# Patient Record
Sex: Male | Born: 1940
Health system: Southern US, Community
[De-identification: ages and names within clinical notes are randomized; demographics above are authoritative.]

## PROBLEM LIST (undated history)

## (undated) DIAGNOSIS — I519 Heart disease, unspecified: Secondary | ICD-10-CM

## (undated) DIAGNOSIS — G4730 Sleep apnea, unspecified: Secondary | ICD-10-CM

## (undated) DIAGNOSIS — N189 Chronic kidney disease, unspecified: Secondary | ICD-10-CM

## (undated) DIAGNOSIS — I2510 Atherosclerotic heart disease of native coronary artery without angina pectoris: Secondary | ICD-10-CM

## (undated) DIAGNOSIS — I82402 Acute embolism and thrombosis of unspecified deep veins of left lower extremity: Secondary | ICD-10-CM

## (undated) DIAGNOSIS — E785 Hyperlipidemia, unspecified: Secondary | ICD-10-CM

## (undated) DIAGNOSIS — I2699 Other pulmonary embolism without acute cor pulmonale: Secondary | ICD-10-CM

## (undated) HISTORY — DX: Heart disease, unspecified: I51.9

## (undated) HISTORY — DX: Chronic kidney disease, unspecified: N18.9

## (undated) HISTORY — DX: Hyperlipidemia, unspecified: E78.5

---
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
## 2015-09-02 DIAGNOSIS — Z951 Presence of aortocoronary bypass graft: Secondary | ICD-10-CM | POA: Insufficient documentation

## 2015-12-06 DIAGNOSIS — I82402 Acute embolism and thrombosis of unspecified deep veins of left lower extremity: Secondary | ICD-10-CM

## 2015-12-06 DIAGNOSIS — I2699 Other pulmonary embolism without acute cor pulmonale: Secondary | ICD-10-CM

## 2015-12-06 HISTORY — DX: Other pulmonary embolism without acute cor pulmonale: I26.99

## 2015-12-06 HISTORY — PX: PROSTATE SURGERY: SHX751

## 2015-12-06 HISTORY — DX: Acute embolism and thrombosis of unspecified deep veins of left lower extremity: I82.402

## 2015-12-06 HISTORY — PX: CORONARY ARTERY BYPASS GRAFT: SHX141

## 2016-12-05 HISTORY — PX: TOTAL SHOULDER REPLACEMENT: SUR1217

## 2019-01-10 ENCOUNTER — Ambulatory Visit: Payer: 59 | Admitting: Internal Medicine

## 2019-01-10 ENCOUNTER — Encounter: Payer: Self-pay | Admitting: Internal Medicine

## 2019-01-10 VITALS — BP 106/70 | HR 61 | Ht 68.0 in | Wt 172.4 lb

## 2019-01-10 DIAGNOSIS — E782 Mixed hyperlipidemia: Secondary | ICD-10-CM

## 2019-01-10 DIAGNOSIS — Z951 Presence of aortocoronary bypass graft: Secondary | ICD-10-CM | POA: Diagnosis not present

## 2019-01-10 DIAGNOSIS — I77810 Thoracic aortic ectasia: Secondary | ICD-10-CM | POA: Diagnosis not present

## 2019-01-10 NOTE — Progress Notes (Signed)
OFFICE NOTE  Chief Complaint:  Establish cardiologist  Primary Care Physician: Cole Prince, MD  HPI:  Cole Walker is a 78 y.o. male with a past medial history significant for coronary artery disease with onset of shortness of breath in 2016.  Cardiac catheterization showed a 99% proximal LAD lesion and ultimately he underwent off pump CABG with LIMA to LAD by Dr. Merleen Walker. Through a left thoracotomy approach.  It is also mentioned that he has a patent ductus arteriosus -it is not clear to me that this was ligated at the time of surgery but may not have been due to the decreased exposure.  He has had some shortness of breath subsequent to this.  Apparently his presenting symptoms was shortness of breath, but he claims he never had chest pain.  More recently he underwent repeat stress testing in June 2019 which was negative for ischemia.  He also has a history of PE/DVT which occurred apparently after shoulder surgery.  He was anticoagulated on warfarin for 6 months and then discontinue that.  He recently established with Dr. Evlyn Walker.  Labs in January 2020 indicated total cholesterol 145, HDL 50, LDL 63 and triglycerides 162.  PMHx:  Past Medical History:  Diagnosis Date  . Chronic kidney disease   . Heart disease   . Hyperlipidemia     Past Surgical History:  Procedure Laterality Date  . CORONARY ARTERY BYPASS GRAFT  2017  . PROSTATE SURGERY  2017  . TOTAL SHOULDER REPLACEMENT  2018    FAMHx:  Family History  Problem Relation Age of Onset  . Heart attack Mother   . Hypertension Mother   . Diabetes Mother   . Stroke Sister   . Cancer Brother     SOCHx:   reports that he has never smoked. His smokeless tobacco use includes chew. No history on file for alcohol and drug.  ALLERGIES:  Allergies  Allergen Reactions  . Sulfa Antibiotics Other (See Comments)    Other reaction(s): GI Upset (intolerance) Other reaction(s): GI Upset (intolerance) Unsure. he was told years  ago.     ROS: Pertinent items noted in HPI and remainder of comprehensive ROS otherwise negative.  HOME MEDS: Current Outpatient Medications on File Prior to Visit  Medication Sig Dispense Refill  . aspirin 81 MG chewable tablet Chew 81 mg by mouth daily.    . Biotin 5000 MCG CAPS Take by mouth.    Marland Kitchen CALCIUM CARBONATE ANTACID PO Take by mouth.    Marland Kitchen Cod Liver Oil CAPS Take by mouth.    . cyanocobalamin 1000 MCG tablet Take 1,000 mcg by mouth daily.    . cycloSPORINE (RESTASIS) 0.05 % ophthalmic emulsion 1 drop 2 (two) times daily.    . finasteride (PROSCAR) 5 MG tablet Take 5 mg by mouth daily.    . folic acid (FOLVITE) 400 MCG tablet Take 400 mcg by mouth daily.    . polyethylene glycol (MIRALAX) packet Take 17 g by mouth daily.    . pravastatin (PRAVACHOL) 40 MG tablet Take 40 mg by mouth daily.    . traMADol (ULTRAM) 50 MG tablet Take by mouth every 6 (six) hours as needed.    . Cholecalciferol (VITAMIN D) 50 MCG (2000 UT) tablet Take by mouth.     No current facility-administered medications on file prior to visit.     LABS/IMAGING: No results found for this or any previous visit (from the past 48 hour(s)). No results found.  LIPID PANEL: No results  found for: CHOL, TRIG, HDL, CHOLHDL, VLDL, LDLCALC, LDLDIRECT   WEIGHTS: Wt Readings from Last 3 Encounters:  01/10/19 172 lb 6.4 oz (78.2 kg)    VITALS: BP 106/70 (BP Location: Left Arm)   Pulse 61   Ht 5\' 8"  (1.727 m)   Wt 172 lb 6.4 oz (78.2 kg)   BMI 26.21 kg/m   EXAM: General appearance: alert and no distress Neck: no carotid bruit, no JVD and thyroid not enlarged, symmetric, no tenderness/mass/nodules Lungs: clear to auscultation bilaterally Heart: systolic murmur: early systolic 2/6, buzzing at 2nd right intercostal space Abdomen: soft, non-tender; bowel sounds normal; no masses,  no organomegaly Extremities: extremities normal, atraumatic, no cyanosis or edema Pulses: 2+ and symmetric Skin: Skin color,  texture, turgor normal. No rashes or lesions Neurologic: Grossly normal Psych: Pleasant  EKG: Sinus rhythm with first-degree AV block at 61 nonspecific ST changes- personally reviewed  ASSESSMENT: 1. CAD with 99% LAD stenosis-status post LIMA to LAD (OPCAB)-2016, [place], [place] Cole Denmark, MD) 2. ? PDA 3. OSA on CPAP 4. Hyperlipidemia 5. CKD 6. History of dilated aortic root  PLAN: 1.   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  There is a question of PDA although the murmur is not entirely consistent with that.  He has a history of OSA on CPAP and dyslipidemia goal LDL.  He had a negative stress test in June last year.  He is on appropriate medications.  No changes were made today.  We will plan a repeat echo in 1 year to reassess his dilated aortic root.  Plan follow-up with me annually or sooner as necessary.  Cole Nose, MD, [facility], FACP  [place]  [facility]  Medical Director of the Advanced Lipid Disorders &  Cardiovascular Risk Reduction Clinic Diplomate of the American Board of Clinical Lipidology Attending Cardiologist  Direct Dial: [phone]  Fax: [phone]  Website:  [email or web address] 01/10/2019, 5:37 PM

## 2019-01-10 NOTE — Patient Instructions (Signed)
Medication Instructions:  Continue current medications  If you need a refill on your cardiac medications before your next appointment, please call your pharmacy.  Labwork: None Ordered   Take the provided lab slips with you to the lab for your blood draw.   When you have your labs (blood work) drawn today and your tests are completely normal, you will receive your results only by MyChart Message (if you have MyChart) -OR-  A paper copy in the mail.  If you have any lab test that is abnormal or we need to change your treatment, we will call you to review these results.  Testing/Procedures: Your physician has requested that you have an echocardiogram in 1 Year. Echocardiography is a painless test that uses sound waves to create images of your heart. It provides your doctor with information about the size and shape of your heart and how well your heart's chambers and valves are working. This procedure takes approximately one hour. There are no restrictions for this procedure.   Follow-Up: You will need a follow up appointment in 1 Year.  Please call our office 2 months in advance to schedule this appointment.  You may see Dr Rennis Golden or one of the following Advanced Practice Providers on your designated Care Team: Azalee Course, [place] . Micah Flesher, PA-C    At [facility], you and your health needs are our priority.  As part of our continuing mission to provide you with exceptional heart care, we have created designated Provider Care Teams.  These Care Teams include your primary Cardiologist (physician) and Advanced Practice Providers (APPs -  Physician Assistants and Nurse Practitioners) who all work together to provide you with the care you need, when you need it.  Thank you for choosing [facility] at [facility]!!

## 2019-02-06 ENCOUNTER — Other Ambulatory Visit (HOSPITAL_COMMUNITY): Payer: Self-pay | Admitting: Orthopedic Surgery

## 2019-02-06 ENCOUNTER — Other Ambulatory Visit: Payer: Self-pay | Admitting: Orthopedic Surgery

## 2019-02-06 DIAGNOSIS — Z96612 Presence of left artificial shoulder joint: Secondary | ICD-10-CM

## 2019-02-12 ENCOUNTER — Other Ambulatory Visit (HOSPITAL_COMMUNITY): Payer: Medicare Other

## 2019-02-15 ENCOUNTER — Encounter (HOSPITAL_COMMUNITY)
Admission: RE | Admit: 2019-02-15 | Discharge: 2019-02-15 | Disposition: A | Discharge status: Home / Self Care | Payer: Medicare Other | Source: Ambulatory Visit | Attending: Orthopedic Surgery | Admitting: Orthopedic Surgery

## 2019-02-15 ENCOUNTER — Other Ambulatory Visit: Payer: Self-pay

## 2019-02-15 DIAGNOSIS — Z96612 Presence of left artificial shoulder joint: Secondary | ICD-10-CM | POA: Insufficient documentation

## 2019-02-15 MED ORDER — TECHNETIUM TC 99M MEDRONATE IV KIT
20.0000 | PACK | Freq: Once | INTRAVENOUS | Status: AC | PRN
  Administered 2019-02-15: 20 via INTRAVENOUS

## 2019-05-30 IMAGING — NM NUCLEAR MEDICINE THREE PHASE BONE SCAN
10 series · 20 of 20 positions shown · non-contrast
Comparison: None.

CLINICAL DATA: History of right total shoulder arthroplasty.
Evaluate for arthroplasty device loosening or infection.

EXAM:
NUCLEAR MEDICINE 3-PHASE BONE SCAN
TECHNIQUE: Radionuclide angiographic images, immediate static blood pool
images, and 3-hour delayed static images were obtained of the upper
chest after intravenous injection of radiopharmaceutical.
RADIOPHARMACEUTICALS:  21.5 mCi 5c-55m MDP IV

[Series 1: flow · 2.07mm/px · 6 of 48 frames shown (1 of 2)]
[frame 5/48]
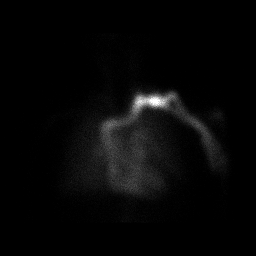
[frame 13/48]
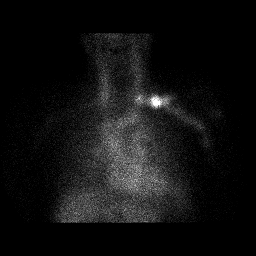
[frame 21/48]
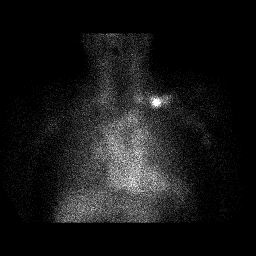
[frame 29/48]
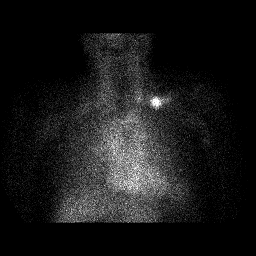
[frame 37/48]
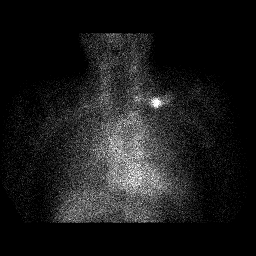
[frame 45/48]
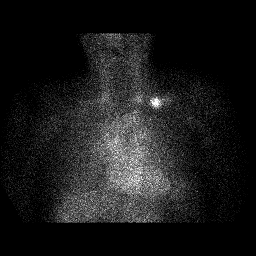

[Series 1: flow · 2.07mm/px · 6 of 48 frames shown (2 of 2)]
[frame 5/48]
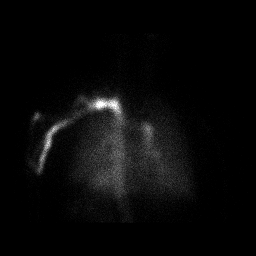
[frame 13/48]
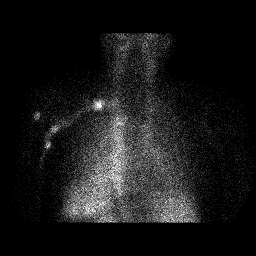
[frame 21/48]
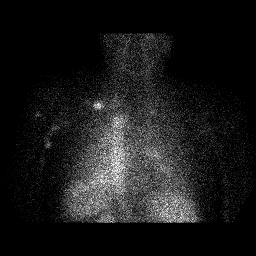
[frame 29/48]
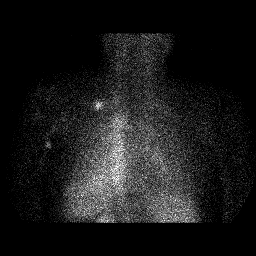
[frame 37/48]
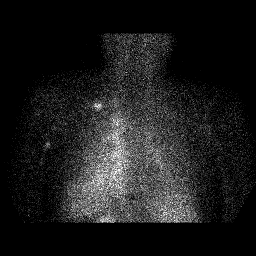
[frame 45/48]
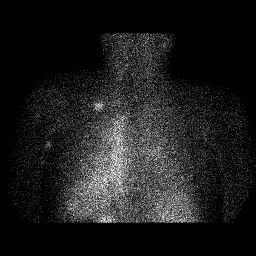

[Series 2: blood pool · 2.07mm/px · 1 of 1 slices shown (1 of 2)]
[im 1/1]
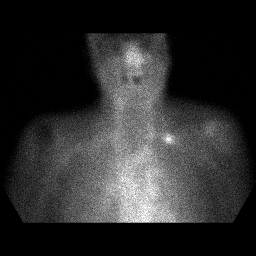

[Series 2: blood pool · 2.07mm/px · 1 of 1 slices shown (2 of 2)]
[im 1/1]
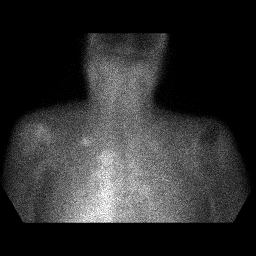

[Series 3: lat bp · 2.07mm/px · 1 of 1 slices shown (1 of 2)]
[im 1/1]
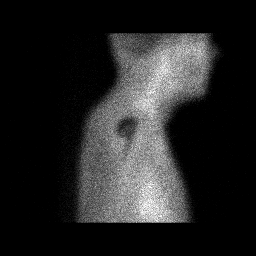

[Series 3: lat bp · 2.07mm/px · 1 of 1 slices shown (2 of 2)]
[im 1/1]
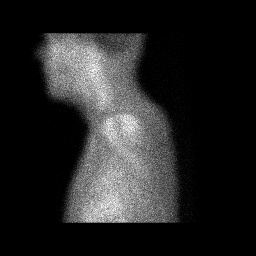

[Series 4: delay · delayed · 2.07mm/px · 1 of 1 slices shown (1 of 4)]
[im 1/1]
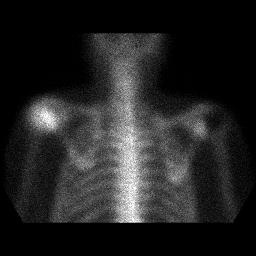

[Series 4: delay · delayed · 2.07mm/px · 1 of 1 slices shown (2 of 4)]
[im 1/1]
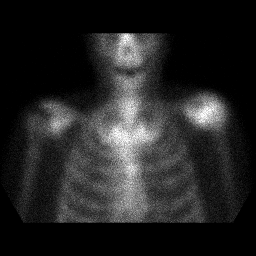

[Series 5: delay · delayed · 2.07mm/px · 1 of 1 slices shown (3 of 4)]
[im 1/1]
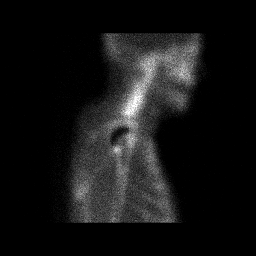

[Series 5: delay · delayed · 2.07mm/px · 1 of 1 slices shown (4 of 4)]
[im 1/1]
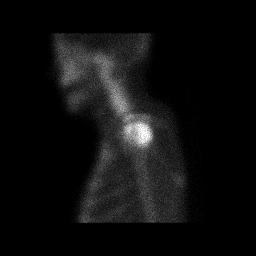

[20 of 20 positions shown; findings below may reference images not displayed]

FINDINGS: Vascular phase: On the flow phase portion of the examination there
is no abnormal increased radiotracer activity localizing to the
right shoulder region.

Blood pool phase: On the blood pool phase portion of the exam there
is no significant asymmetric increased uptake localizing to the
right shoulder. Mild increased blood pool activity is identified in
the left shoulder region.

Delayed phase: Increased uptake localizing to the glenoid region of
the right shoulder, nonspecific. Increased radiotracer activity is
noted within the glenohumeral joint of the left shoulder.
IMPRESSION: 1. No specific findings identified to suggest right shoulder
arthroplasty device loosening or infection.
2. Nonspecific increased uptake localizes to the glenoid region of
the right shoulder
3. Left shoulder degenerative changes.

## 2019-06-05 NOTE — Progress Notes (Signed)
01-10-19 (Epic) EKG and LOV w/ Dr. Debara Pickett Cardiologist

## 2019-06-05 NOTE — Patient Instructions (Addendum)
YOU NEED TO HAVE A COVID 19 TEST ON 06-10-19 @ 3:05 PM, THIS TEST MUST BE DONE BEFORE SURGERY, [facility]. ONCE YOUR COVID TEST IS COMPLETED, PLEASE BEGIN THE QUARANTINE INSTRUCTIONS AS OUTLINED IN YOUR HANDOUT.                Cole Walker    Your procedure is scheduled on: 06-13-2019   [facility]    Report to admitting at 5:30  AM    Call this number if you have problems the morning of surgery [phone]    Remember:  NO SOLID FOOD AFTER MIDNIGHT THE NIGHT PRIOR TO SURGERY. NOTHING BY MOUTH EXCEPT CLEAR LIQUIDS UNTIL 430 AM. PLEASE FINISH ENSURE DRINK PER SURGEON ORDER 3 HOURS PRIOR TO SCHEDULED SURGERY TIME WHICH NEEDS TO BE COMPLETED AT 430 AM.   CLEAR LIQUID DIET   Foods Allowed                                                                     Foods Excluded  Coffee and tea, regular and decaf                             liquids that you cannot  Plain Jell-O in any flavor                                             see through such as: Fruit ices (not with fruit pulp)                                     milk, soups, orange juice  Iced Popsicles                                    All solid food Carbonated beverages, regular and diet                                    Cranberry, grape and apple juices Sports drinks like Gatorade Lightly seasoned clear broth or consume(fat free) Sugar, honey syrup  Sample Menu Breakfast                                Lunch                                     Supper Cranberry juice                    Beef broth                            Chicken broth Jell-O  Grape juice                           Apple juice Coffee or tea                        Jell-O                                      Popsicle                                                Coffee or tea                        Coffee or  tea  _____________________________________________________________________     Take these medicines the morning of surgery with A SIP OF WATER: None      BRUSH YOUR TEETH MORNING OF SURGERY AND RINSE YOUR MOUTH OUT, NO CHEWING GUM CANDY OR MINTS.                           You may not have any metal on your body including hair pins and              piercings     Do not wear jewelry, cologne, lotions, powders or deodorant                         Men may shave face and neck.   Do not bring valuables to the hospital. [facility] IS NOT             RESPONSIBLE   FOR VALUABLES.  Contacts, dentures or bridgework may not be worn into surgery.       _____________________________________________________________________             [facility] Pre[facility] Before surgery, you can play an important role.  Because skin is not sterile, your skin needs to be as free of germs as possible.  You can reduce the number of germs on your skin by washing with CHG (chlorahexidine gluconate) soap before surgery.  CHG is an antiseptic cleaner which kills germs and bonds with the skin to continue killing germs even after washing. Please DO NOT use if you have an allergy to CHG or antibacterial soaps.  If your skin becomes reddened/irritated stop using the CHG and inform your nurse when you arrive at Short Stay. Do not shave (including legs and underarms) for at least 48 hours prior to the first CHG shower.  You may shave your face/neck. Please follow these instructions carefully:  1.  Shower with CHG Soap the night before surgery and the  morning of Surgery.  2.  If you choose to wash your hair, wash your hair first as usual with your  normal  shampoo.  3.  After you shampoo, rinse your hair and body thoroughly to remove the  shampoo.                           4.  Use CHG as you would any other liquid soap.  You can  apply chg directly  to the skin and wash                       Gently with a  scrungie or clean washcloth.  5.  Apply the CHG Soap to your body ONLY FROM THE NECK DOWN.   Do not use on face/ open                           Wound or open sores. Avoid contact with eyes, ears mouth and genitals (private parts).                       Wash face,  Genitals (private parts) with your normal soap.             6.  Wash thoroughly, paying special attention to the area where your surgery  will be performed.  7.  Thoroughly rinse your body with warm water from the neck down.  8.  DO NOT shower/wash with your normal soap after using and rinsing off  the CHG Soap.                9.  Pat yourself dry with a clean towel.            10.  Wear clean pajamas.            11.  Place clean sheets on your bed the night of your first shower and do not  sleep with pets. Day of Surgery : Do not apply any lotions/deodorants the morning of surgery.  Please wear clean clothes to the hospital/surgery center.  FAILURE TO FOLLOW THESE INSTRUCTIONS MAY RESULT IN THE CANCELLATION OF YOUR SURGERY PATIENT SIGNATURE_________________________________  NURSE SIGNATURE__________________________________  ________________________________________________________________________   Cole Walker  An incentive spirometer is a tool that can help keep your lungs clear and active. This tool measures how well you are filling your lungs with each breath. Taking long deep breaths may help reverse or decrease the chance of developing breathing (pulmonary) problems (especially infection) following:  A long period of time when you are unable to move or be active. BEFORE THE PROCEDURE   If the spirometer includes an indicator to show your best effort, your nurse or respiratory therapist will set it to a desired goal.  If possible, sit up straight or lean slightly forward. Try not to slouch.  Hold the incentive spirometer in an upright position. INSTRUCTIONS FOR USE  1. Sit on the edge of your bed if possible,  or sit up as far as you can in bed or on a chair. 2. Hold the incentive spirometer in an upright position. 3. Breathe out normally. 4. Place the mouthpiece in your mouth and seal your lips tightly around it. 5. Breathe in slowly and as deeply as possible, raising the piston or the ball toward the top of the column. 6. Hold your breath for 3-5 seconds or for as long as possible. Allow the piston or ball to fall to the bottom of the column. 7. Remove the mouthpiece from your mouth and breathe out normally. 8. Rest for a few seconds and repeat Steps 1 through 7 at least 10 times every 1-2 hours when you are awake. Take your time and take a few normal breaths between deep breaths. 9. The spirometer may include an indicator to show your best effort. Use the indicator as a goal to work toward during  each repetition. 10. After each set of 10 deep breaths, practice coughing to be sure your lungs are clear. If you have an incision (the cut made at the time of surgery), support your incision when coughing by placing a pillow or rolled up towels firmly against it. Once you are able to get out of bed, walk around indoors and cough well. You may stop using the incentive spirometer when instructed by your caregiver.  RISKS AND COMPLICATIONS  Take your time so you do not get dizzy or light-headed.  If you are in pain, you may need to take or ask for pain medication before doing incentive spirometry. It is harder to take a deep breath if you are having pain. AFTER USE  Rest and breathe slowly and easily.  It can be helpful to keep track of a log of your progress. Your caregiver can provide you with a simple table to help with this. If you are using the spirometer at home, follow these instructions: SEEK MEDICAL CARE IF:   You are having difficultly using the spirometer.  You have trouble using the spirometer as often as instructed.  Your pain medication is not giving enough relief while using the  spirometer.  You develop fever of 100.5 F (38.1 C) or higher. SEEK IMMEDIATE MEDICAL CARE IF:   You cough up bloody sputum that had not been present before.  You develop fever of 102 F (38.9 C) or greater.  You develop worsening pain at or near the incision site. MAKE SURE YOU:   Understand these instructions.  Will watch your condition.  Will get help right away if you are not doing well or get worse. Document Released: 04/03/2007 Document Revised: 02/13/2012 Document Reviewed: 06/04/2007 [facility] Patient Information 2014 [place].   ________________________________________________________________________

## 2019-06-06 ENCOUNTER — Encounter (HOSPITAL_COMMUNITY): Payer: Self-pay

## 2019-06-06 ENCOUNTER — Other Ambulatory Visit: Payer: Self-pay

## 2019-06-06 ENCOUNTER — Encounter (HOSPITAL_COMMUNITY)
Admission: RE | Admit: 2019-06-06 | Discharge: 2019-06-06 | Disposition: A | Discharge status: Home / Self Care | Payer: Medicare Other | Source: Ambulatory Visit | Attending: Orthopedic Surgery | Admitting: Orthopedic Surgery

## 2019-06-06 DIAGNOSIS — M75102 Unspecified rotator cuff tear or rupture of left shoulder, not specified as traumatic: Secondary | ICD-10-CM | POA: Insufficient documentation

## 2019-06-06 DIAGNOSIS — Z01812 Encounter for preprocedural laboratory examination: Secondary | ICD-10-CM | POA: Diagnosis present

## 2019-06-06 HISTORY — DX: Sleep apnea, unspecified: G47.30

## 2019-06-06 HISTORY — DX: Atherosclerotic heart disease of native coronary artery without angina pectoris: I25.10

## 2019-06-06 LAB — BASIC METABOLIC PANEL
Anion gap: 8 (ref 5–15)
BUN: 24 mg/dL — ABNORMAL HIGH (ref 8–23)
CO2: 29 mmol/L (ref 22–32)
Calcium: 9.4 mg/dL (ref 8.9–10.3)
Chloride: 106 mmol/L (ref 98–111)
Creatinine, Ser: 1.49 mg/dL — ABNORMAL HIGH (ref 0.61–1.24)
GFR calc Af Amer: 51 mL/min — ABNORMAL LOW (ref >60)
GFR calc non Af Amer: 44 mL/min — ABNORMAL LOW (ref >60)
Glucose, Bld: 86 mg/dL (ref 70–99)
Potassium: 4.2 mmol/L (ref 3.5–5.1)
Sodium: 143 mmol/L (ref 135–145)

## 2019-06-06 LAB — CBC
HCT: 43.2 % (ref 39.0–52.0)
Hemoglobin: 14 g/dL (ref 13.0–17.0)
MCH: 30.2 pg (ref 26.0–34.0)
MCHC: 32.4 g/dL (ref 30.0–36.0)
MCV: 93.3 fL (ref 80.0–100.0)
Platelets: 224 10*3/uL (ref 150–400)
RBC: 4.63 MIL/uL (ref 4.22–5.81)
RDW: 12.3 % (ref 11.5–15.5)
WBC: 6.4 10*3/uL (ref 4.0–10.5)
nRBC: 0 % (ref 0.0–0.2)

## 2019-06-06 LAB — SURGICAL PCR SCREEN
MRSA, PCR: NEGATIVE
Staphylococcus aureus: NEGATIVE

## 2019-06-10 ENCOUNTER — Other Ambulatory Visit (HOSPITAL_COMMUNITY)
Admission: RE | Admit: 2019-06-10 | Discharge: 2019-06-10 | Disposition: A | Discharge status: Home / Self Care | Payer: Medicare Other | Source: Ambulatory Visit | Attending: Orthopedic Surgery | Admitting: Orthopedic Surgery

## 2019-06-10 DIAGNOSIS — Z01812 Encounter for preprocedural laboratory examination: Secondary | ICD-10-CM | POA: Insufficient documentation

## 2019-06-10 DIAGNOSIS — Z1159 Encounter for screening for other viral diseases: Secondary | ICD-10-CM | POA: Insufficient documentation

## 2019-06-10 NOTE — Progress Notes (Signed)
Anesthesia Chart Review   Case: [id number] Date/Time: 06/13/19 0715   Procedure: REVERSE SHOULDER ARTHROPLASTY (Left ) - 144min   Anesthesia type: General   Pre-op diagnosis: left shoulder osteoarthritis   Location: [facility] ROOM 06 / [facility]   Surgeon: Justice Britain, MD      DISCUSSION: 78 y.o. never smoker with h/o HLD, CAD (CABG with LIMA to LAD 2016), DVT/PE 2017 after shoulder surgery, CKD, sleep apnea on CPAP, left shoulder OA scheduled for above procedure 06/13/2019 with Dr. Justice Britain.   Stress test 05/2018 negative for ischemia.    Pt last seen by cardiologist, Dr. Lyman Bishop, 01/10/2019.  Stable at this visit with no medication changes.  Recommended repeat echo in 1 year with 1 year follow up with Dr. Debara Pickett.   Anticipate pt can proceed with planned procedure barring acute status change.   VS: BP 124/70   Pulse 63   Temp 37.2 C (Oral)   Resp 18   Ht 5\' 8"  (1.727 m)   Wt 79.6 kg   SpO2 100%   BMI 26.67 kg/m   PROVIDERS: Reynold Bowen, MD is PCP   Lyman Bishop, MD is Cardiologist  LABS: Labs reviewed: Acceptable for surgery. (all labs ordered are listed, but only abnormal results are displayed)  Labs Reviewed  BASIC METABOLIC PANEL - Abnormal; Notable for the following components:      Result Value   BUN 24 (*)    Creatinine, Ser 1.49 (*)    GFR calc non Af Amer 44 (*)    GFR calc Af Amer 51 (*)    All other components within normal limits  SURGICAL PCR SCREEN  CBC     IMAGES:   EKG: 01/10/2019 Rate 61 bpm Sinus rhythm with 1st degree AV block  Nonspecific T wave abnormality   CV:  Past Medical History:  Diagnosis Date  . Chronic kidney disease   . Coronary artery disease   . Deep vein blood clot of left lower extremity ([place]) 2017  . Heart disease   . Hyperlipidemia   . Pulmonary emboli ([place]) 2017  . Sleep apnea     Past Surgical History:  Procedure Laterality Date  . CORONARY ARTERY BYPASS GRAFT  2017  . PROSTATE SURGERY  2017  . TOTAL  SHOULDER REPLACEMENT  2018    MEDICATIONS: . aspirin 81 MG chewable tablet  . Biotin 5000 MCG CAPS  . calcium carbonate (TUMS - DOSED IN MG ELEMENTAL CALCIUM) 500 MG chewable tablet  . Cholecalciferol (VITAMIN D) 50 MCG (2000 UT) tablet  . Cod Liver Oil 1000 MG CAPS  . cycloSPORINE (RESTASIS) 0.05 % ophthalmic emulsion  . finasteride (PROSCAR) 5 MG tablet  . folic acid (FOLVITE) 403 MCG tablet  . Multiple Vitamin (MULTIVITAMIN WITH MINERALS) TABS tablet  . polyethylene glycol (MIRALAX) packet  . pravastatin (PRAVACHOL) 40 MG tablet  . vitamin B-12 (CYANOCOBALAMIN) 250 MCG tablet   No current facility-administered medications for this encounter.     Maia Plan [facility] Pre-Surgical Testing [phone] 06/10/19  11:58 AM

## 2019-06-10 NOTE — Anesthesia Preprocedure Evaluation (Addendum)
Anesthesia Evaluation  Patient identified by MRN, date of birth, ID band Patient awake    Reviewed: Allergy & Precautions, NPO status , Patient's Chart, lab work & pertinent test results  Airway Mallampati: II  TM Distance: >3 FB Neck ROM: Full    Dental no notable dental hx.    Pulmonary sleep apnea and Continuous Positive Airway Pressure Ventilation , PE   Pulmonary exam normal breath sounds clear to auscultation       Cardiovascular Exercise Tolerance: Good + CAD, + CABG and + DVT  Normal cardiovascular exam Rhythm:Regular Rate:Normal  ECG: SR, 1st degree AV block  Pt last seen by cardiologist, Dr. Lyman Bishop, 01/10/2019.  Stable at this visit with no medication changes.  Recommended repeat echo in 1 year with 1 year follow up with Dr. Debara Pickett.    Neuro/Psych negative neurological ROS  negative psych ROS   GI/Hepatic negative GI ROS, Neg liver ROS,   Endo/Other  negative endocrine ROS  Renal/GU Renal disease     Musculoskeletal negative musculoskeletal ROS (+)   Abdominal   Peds  Hematology HLD   Anesthesia Other Findings left shoulder osteoarthritis  Reproductive/Obstetrics                            Anesthesia Physical Anesthesia Plan  ASA: III  Anesthesia Plan: General and Regional   Post-op Pain Management: GA combined w/ Regional for post-op pain   Induction: Intravenous  PONV Risk Score and Plan: 2 and Ondansetron, Dexamethasone and Treatment may vary due to age or medical condition  Airway Management Planned: Oral ETT  Additional Equipment:   Intra-op Plan:   Post-operative Plan: Extubation in OR  Informed Consent: I have reviewed the patients History and Physical, chart, labs and discussed the procedure including the risks, benefits and alternatives for the proposed anesthesia with the patient or authorized representative who has indicated his/her understanding and  acceptance.     Dental advisory given  Plan Discussed with: CRNA  Anesthesia Plan Comments: (Reviewed PAT note 06/06/2019, Konrad Felix, PA-C)      Anesthesia Quick Evaluation

## 2019-06-11 LAB — SARS CORONAVIRUS 2 (TAT 6-24 HRS): SARS Coronavirus 2: NEGATIVE

## 2019-06-13 ENCOUNTER — Inpatient Hospital Stay (HOSPITAL_COMMUNITY): Payer: Medicare Other | Admitting: Certified Registered Nurse Anesthetist

## 2019-06-13 ENCOUNTER — Encounter (HOSPITAL_COMMUNITY): Payer: Self-pay | Admitting: *Deleted

## 2019-06-13 ENCOUNTER — Encounter (HOSPITAL_COMMUNITY)
Admission: RE | Disposition: A | Discharge status: Home / Self Care | Payer: Self-pay | Source: Ambulatory Visit | Attending: Orthopedic Surgery

## 2019-06-13 ENCOUNTER — Other Ambulatory Visit: Payer: Self-pay

## 2019-06-13 ENCOUNTER — Inpatient Hospital Stay (HOSPITAL_COMMUNITY)
Admission: RE | Admit: 2019-06-13 | Discharge: 2019-06-14 | DRG: 483 | Disposition: A | Discharge status: Home / Self Care | Payer: Medicare Other | Source: Ambulatory Visit | Attending: Orthopedic Surgery | Admitting: Orthopedic Surgery

## 2019-06-13 ENCOUNTER — Inpatient Hospital Stay (HOSPITAL_COMMUNITY): Payer: Medicare Other | Admitting: Physician Assistant

## 2019-06-13 DIAGNOSIS — Z951 Presence of aortocoronary bypass graft: Secondary | ICD-10-CM | POA: Diagnosis not present

## 2019-06-13 DIAGNOSIS — Z96619 Presence of unspecified artificial shoulder joint: Secondary | ICD-10-CM | POA: Diagnosis present

## 2019-06-13 DIAGNOSIS — M19012 Primary osteoarthritis, left shoulder: Secondary | ICD-10-CM | POA: Diagnosis present

## 2019-06-13 DIAGNOSIS — E785 Hyperlipidemia, unspecified: Secondary | ICD-10-CM | POA: Diagnosis present

## 2019-06-13 DIAGNOSIS — M75102 Unspecified rotator cuff tear or rupture of left shoulder, not specified as traumatic: Secondary | ICD-10-CM | POA: Diagnosis present

## 2019-06-13 DIAGNOSIS — G4730 Sleep apnea, unspecified: Secondary | ICD-10-CM | POA: Diagnosis present

## 2019-06-13 DIAGNOSIS — Z79899 Other long term (current) drug therapy: Secondary | ICD-10-CM

## 2019-06-13 DIAGNOSIS — I2510 Atherosclerotic heart disease of native coronary artery without angina pectoris: Secondary | ICD-10-CM | POA: Diagnosis present

## 2019-06-13 DIAGNOSIS — N189 Chronic kidney disease, unspecified: Secondary | ICD-10-CM | POA: Diagnosis present

## 2019-06-13 DIAGNOSIS — Z96612 Presence of left artificial shoulder joint: Secondary | ICD-10-CM

## 2019-06-13 DIAGNOSIS — Z86711 Personal history of pulmonary embolism: Secondary | ICD-10-CM

## 2019-06-13 DIAGNOSIS — Z7982 Long term (current) use of aspirin: Secondary | ICD-10-CM | POA: Diagnosis not present

## 2019-06-13 DIAGNOSIS — Z86718 Personal history of other venous thrombosis and embolism: Secondary | ICD-10-CM

## 2019-06-13 HISTORY — PX: REVERSE SHOULDER ARTHROPLASTY: SHX5054

## 2019-06-13 SURGERY — ARTHROPLASTY, SHOULDER, TOTAL, REVERSE
Anesthesia: Regional | Laterality: Left

## 2019-06-13 MED ORDER — DEXAMETHASONE SODIUM PHOSPHATE 10 MG/ML IJ SOLN
INTRAMUSCULAR | Status: AC
  Filled 2019-06-13: qty 1

## 2019-06-13 MED ORDER — FENTANYL CITRATE (PF) 100 MCG/2ML IJ SOLN
INTRAMUSCULAR | Status: AC
  Filled 2019-06-13: qty 2

## 2019-06-13 MED ORDER — CHLORHEXIDINE GLUCONATE 4 % EX LIQD: 60.0000 mL | Freq: Once | CUTANEOUS | Status: DC

## 2019-06-13 MED ORDER — ACETAMINOPHEN 500 MG PO TABS
1000.0000 mg | ORAL_TABLET | Freq: Once | ORAL | Status: AC
  Administered 2019-06-13: 1000 mg via ORAL

## 2019-06-13 MED ORDER — ONDANSETRON HCL 4 MG/2ML IJ SOLN: 4.0000 mg | Freq: Once | INTRAMUSCULAR | Status: DC | PRN

## 2019-06-13 MED ORDER — ROCURONIUM BROMIDE 10 MG/ML (PF) SYRINGE
PREFILLED_SYRINGE | INTRAVENOUS | Status: AC
  Filled 2019-06-13: qty 10

## 2019-06-13 MED ORDER — HYDROMORPHONE HCL 1 MG/ML IJ SOLN: 0.5000 mg | INTRAMUSCULAR | Status: DC | PRN

## 2019-06-13 MED ORDER — FENTANYL CITRATE (PF) 100 MCG/2ML IJ SOLN
INTRAMUSCULAR | Status: DC | PRN
  Administered 2019-06-13 (×2): 50 ug via INTRAVENOUS

## 2019-06-13 MED ORDER — GLYCOPYRROLATE PF 0.2 MG/ML IJ SOSY
PREFILLED_SYRINGE | INTRAMUSCULAR | Status: DC | PRN
  Administered 2019-06-13: 0.4 mg via INTRAVENOUS

## 2019-06-13 MED ORDER — DIPHENHYDRAMINE HCL 12.5 MG/5ML PO ELIX: 12.5000 mg | ORAL_SOLUTION | ORAL | Status: DC | PRN

## 2019-06-13 MED ORDER — CEFAZOLIN SODIUM-DEXTROSE 2-4 GM/100ML-% IV SOLN
INTRAVENOUS | Status: AC
  Filled 2019-06-13: qty 100

## 2019-06-13 MED ORDER — LIDOCAINE 2% (20 MG/ML) 5 ML SYRINGE
INTRAMUSCULAR | Status: AC
  Filled 2019-06-13: qty 5

## 2019-06-13 MED ORDER — ACETAMINOPHEN 325 MG PO TABS
325.0000 mg | ORAL_TABLET | Freq: Four times a day (QID) | ORAL | Status: DC | PRN
  Administered 2019-06-14: 650 mg via ORAL
  Filled 2019-06-13: qty 2

## 2019-06-13 MED ORDER — TRANEXAMIC ACID-NACL 1000-0.7 MG/100ML-% IV SOLN
1000.0000 mg | INTRAVENOUS | Status: AC
  Administered 2019-06-13: 1000 mg via INTRAVENOUS

## 2019-06-13 MED ORDER — METHOCARBAMOL 500 MG PO TABS
500.0000 mg | ORAL_TABLET | Freq: Four times a day (QID) | ORAL | Status: DC | PRN
  Administered 2019-06-14: 500 mg via ORAL
  Filled 2019-06-13: qty 1

## 2019-06-13 MED ORDER — OXYCODONE HCL 5 MG PO TABS
5.0000 mg | ORAL_TABLET | ORAL | Status: DC | PRN
  Filled 2019-06-13: qty 1

## 2019-06-13 MED ORDER — METHOCARBAMOL 500 MG IVPB - SIMPLE MED
500.0000 mg | Freq: Four times a day (QID) | INTRAVENOUS | Status: DC | PRN
  Filled 2019-06-13: qty 50

## 2019-06-13 MED ORDER — LACTATED RINGERS IV SOLN
INTRAVENOUS | Status: DC
  Administered 2019-06-13: 13:00:00 via INTRAVENOUS

## 2019-06-13 MED ORDER — ASPIRIN 81 MG PO CHEW
81.0000 mg | CHEWABLE_TABLET | Freq: Every day | ORAL | Status: DC
  Administered 2019-06-14: 81 mg via ORAL
  Filled 2019-06-13: qty 1

## 2019-06-13 MED ORDER — MAGNESIUM CITRATE PO SOLN: 1.0000 | Freq: Once | ORAL | Status: DC | PRN

## 2019-06-13 MED ORDER — ONDANSETRON HCL 4 MG PO TABS: 4.0000 mg | ORAL_TABLET | Freq: Four times a day (QID) | ORAL | Status: DC | PRN

## 2019-06-13 MED ORDER — POLYETHYLENE GLYCOL 3350 17 G PO PACK: 17.0000 g | PACK | Freq: Every day | ORAL | Status: DC | PRN

## 2019-06-13 MED ORDER — SUGAMMADEX SODIUM 200 MG/2ML IV SOLN
INTRAVENOUS | Status: DC | PRN
  Administered 2019-06-13: 160 mg via INTRAVENOUS

## 2019-06-13 MED ORDER — ONDANSETRON HCL 4 MG/2ML IJ SOLN
INTRAMUSCULAR | Status: DC | PRN
  Administered 2019-06-13: 4 mg via INTRAVENOUS

## 2019-06-13 MED ORDER — ONDANSETRON HCL 4 MG/2ML IJ SOLN: 4.0000 mg | Freq: Four times a day (QID) | INTRAMUSCULAR | Status: DC | PRN

## 2019-06-13 MED ORDER — TRANEXAMIC ACID-NACL 1000-0.7 MG/100ML-% IV SOLN
INTRAVENOUS | Status: AC
  Filled 2019-06-13: qty 100

## 2019-06-13 MED ORDER — SODIUM CHLORIDE 0.9 % IR SOLN
Status: DC | PRN
  Administered 2019-06-13: 1000 mL

## 2019-06-13 MED ORDER — ONDANSETRON HCL 4 MG/2ML IJ SOLN
INTRAMUSCULAR | Status: AC
  Filled 2019-06-13: qty 2

## 2019-06-13 MED ORDER — MENTHOL 3 MG MT LOZG: 1.0000 | LOZENGE | OROMUCOSAL | Status: DC | PRN

## 2019-06-13 MED ORDER — PHENOL 1.4 % MT LIQD: 1.0000 | OROMUCOSAL | Status: DC | PRN

## 2019-06-13 MED ORDER — OXYCODONE HCL 5 MG PO TABS: 10.0000 mg | ORAL_TABLET | ORAL | Status: DC | PRN

## 2019-06-13 MED ORDER — METOCLOPRAMIDE HCL 5 MG/ML IJ SOLN: 5.0000 mg | Freq: Three times a day (TID) | INTRAMUSCULAR | Status: DC | PRN

## 2019-06-13 MED ORDER — BISACODYL 5 MG PO TBEC: 5.0000 mg | DELAYED_RELEASE_TABLET | Freq: Every day | ORAL | Status: DC | PRN

## 2019-06-13 MED ORDER — MIDAZOLAM HCL 2 MG/2ML IJ SOLN
INTRAMUSCULAR | Status: AC
  Filled 2019-06-13: qty 2

## 2019-06-13 MED ORDER — BUPIVACAINE HCL (PF) 0.5 % IJ SOLN
INTRAMUSCULAR | Status: DC | PRN
  Administered 2019-06-13: 15 mL

## 2019-06-13 MED ORDER — SUCCINYLCHOLINE CHLORIDE 200 MG/10ML IV SOSY
PREFILLED_SYRINGE | INTRAVENOUS | Status: AC
  Filled 2019-06-13: qty 10

## 2019-06-13 MED ORDER — ROCURONIUM BROMIDE 50 MG/5ML IV SOSY
PREFILLED_SYRINGE | INTRAVENOUS | Status: DC | PRN
  Administered 2019-06-13: 50 mg via INTRAVENOUS

## 2019-06-13 MED ORDER — BUPIVACAINE LIPOSOME 1.3 % IJ SUSP
INTRAMUSCULAR | Status: DC | PRN
  Administered 2019-06-13: 10 mL via PERINEURAL

## 2019-06-13 MED ORDER — METOCLOPRAMIDE HCL 5 MG PO TABS: 5.0000 mg | ORAL_TABLET | Freq: Three times a day (TID) | ORAL | Status: DC | PRN

## 2019-06-13 MED ORDER — LIDOCAINE 2% (20 MG/ML) 5 ML SYRINGE
INTRAMUSCULAR | Status: DC | PRN
  Administered 2019-06-13: 40 mg via INTRAVENOUS

## 2019-06-13 MED ORDER — SODIUM CHLORIDE 0.9 % IV SOLN
INTRAVENOUS | Status: DC | PRN
  Administered 2019-06-13: 40 ug/min via INTRAVENOUS

## 2019-06-13 MED ORDER — EPHEDRINE SULFATE-NACL 50-0.9 MG/10ML-% IV SOSY
PREFILLED_SYRINGE | INTRAVENOUS | Status: DC | PRN
  Administered 2019-06-13: 10 mg via INTRAVENOUS

## 2019-06-13 MED ORDER — FINASTERIDE 5 MG PO TABS
5.0000 mg | ORAL_TABLET | Freq: Every day | ORAL | Status: DC
  Administered 2019-06-14: 08:00:00 5 mg via ORAL
  Filled 2019-06-13: qty 1

## 2019-06-13 MED ORDER — DOCUSATE SODIUM 100 MG PO CAPS
100.0000 mg | ORAL_CAPSULE | Freq: Two times a day (BID) | ORAL | Status: DC
  Administered 2019-06-13 – 2019-06-14 (×2): 100 mg via ORAL
  Filled 2019-06-13 (×2): qty 1

## 2019-06-13 MED ORDER — ALUM & MAG HYDROXIDE-SIMETH 200-200-20 MG/5ML PO SUSP: 30.0000 mL | ORAL | Status: DC | PRN

## 2019-06-13 MED ORDER — KETOROLAC TROMETHAMINE 15 MG/ML IJ SOLN
7.5000 mg | Freq: Four times a day (QID) | INTRAMUSCULAR | Status: DC
  Administered 2019-06-14 (×2): 7.5 mg via INTRAVENOUS
  Filled 2019-06-13 (×2): qty 1

## 2019-06-13 MED ORDER — PHENYLEPHRINE HCL (PRESSORS) 10 MG/ML IV SOLN
INTRAVENOUS | Status: AC
  Filled 2019-06-13: qty 1

## 2019-06-13 MED ORDER — CEFAZOLIN SODIUM-DEXTROSE 2-4 GM/100ML-% IV SOLN
2.0000 g | INTRAVENOUS | Status: AC
  Administered 2019-06-13: 2 g via INTRAVENOUS

## 2019-06-13 MED ORDER — FENTANYL CITRATE (PF) 100 MCG/2ML IJ SOLN: 25.0000 ug | INTRAMUSCULAR | Status: DC | PRN

## 2019-06-13 MED ORDER — LACTATED RINGERS IV SOLN
INTRAVENOUS | Status: DC
  Administered 2019-06-13: 06:00:00 via INTRAVENOUS

## 2019-06-13 MED ORDER — ACETAMINOPHEN 500 MG PO TABS
ORAL_TABLET | ORAL | Status: AC
  Administered 2019-06-13: 06:00:00 1000 mg via ORAL
  Filled 2019-06-13: qty 2

## 2019-06-13 MED ORDER — GLYCOPYRROLATE PF 0.2 MG/ML IJ SOSY
PREFILLED_SYRINGE | INTRAMUSCULAR | Status: AC
  Filled 2019-06-13: qty 1

## 2019-06-13 MED ORDER — PROPOFOL 10 MG/ML IV BOLUS
INTRAVENOUS | Status: DC | PRN
  Administered 2019-06-13: 150 mg via INTRAVENOUS

## 2019-06-13 MED ORDER — DEXAMETHASONE SODIUM PHOSPHATE 10 MG/ML IJ SOLN
INTRAMUSCULAR | Status: DC | PRN
  Administered 2019-06-13: 10 mg via INTRAVENOUS

## 2019-06-13 MED ORDER — TRAMADOL HCL 50 MG PO TABS: 50.0000 mg | ORAL_TABLET | Freq: Four times a day (QID) | ORAL | Status: DC | PRN

## 2019-06-13 MED ORDER — PROPOFOL 10 MG/ML IV BOLUS
INTRAVENOUS | Status: AC
  Filled 2019-06-13: qty 20

## 2019-06-13 SURGICAL SUPPLY — 68 items
BAG ZIPLOCK 12X15 (MISCELLANEOUS) ×3 IMPLANT
BLADE SAW SGTL 83.5X18.5 (BLADE) ×3 IMPLANT
COOLER ICEMAN CLASSIC (MISCELLANEOUS) IMPLANT
COVER SURGICAL LIGHT HANDLE (MISCELLANEOUS) ×3 IMPLANT
COVER WAND RF STERILE (DRAPES) IMPLANT
CUP SUT UNIV REVERS 39 NEU (Shoulder) ×2 IMPLANT
DERMABOND ADVANCED (GAUZE/BANDAGES/DRESSINGS) ×2
DERMABOND ADVANCED .7 DNX12 (GAUZE/BANDAGES/DRESSINGS) ×1 IMPLANT
DRAPE INCISE IOBAN 66X45 STRL (DRAPES) IMPLANT
DRAPE ORTHO SPLIT 77X108 STRL (DRAPES) ×4
DRAPE SURG 17X11 SM STRL (DRAPES) ×3 IMPLANT
DRAPE SURG ORHT 6 SPLT 77X108 (DRAPES) ×2 IMPLANT
DRAPE U-SHAPE 47X51 STRL (DRAPES) ×3 IMPLANT
DRESSING AQUACEL AG SP 3.5X10 (GAUZE/BANDAGES/DRESSINGS) IMPLANT
DRSG AQUACEL AG ADV 3.5X10 (GAUZE/BANDAGES/DRESSINGS) ×3 IMPLANT
DRSG AQUACEL AG SP 3.5X10 (GAUZE/BANDAGES/DRESSINGS) ×3
DURAPREP 26ML APPLICATOR (WOUND CARE) ×3 IMPLANT
ELECT BLADE TIP CTD 4 INCH (ELECTRODE) ×3 IMPLANT
ELECT REM PT RETURN 15FT ADLT (MISCELLANEOUS) ×3 IMPLANT
FACESHIELD WRAPAROUND (MASK) ×12 IMPLANT
FACESHIELD WRAPAROUND OR TEAM (MASK) ×4 IMPLANT
GLENOID UNI REV MOD 24 +2 LAT (Joint) ×2 IMPLANT
GLENOSPHERE 39+4 LAT/24 UNI RV (Joint) ×2 IMPLANT
GLOVE BIO SURGEON STRL SZ7.5 (GLOVE) ×3 IMPLANT
GLOVE BIO SURGEON STRL SZ8 (GLOVE) ×3 IMPLANT
GLOVE SS BIOGEL STRL SZ 7 (GLOVE) ×1 IMPLANT
GLOVE SS BIOGEL STRL SZ 7.5 (GLOVE) ×1 IMPLANT
GLOVE SUPERSENSE BIOGEL SZ 7 (GLOVE) ×2
GLOVE SUPERSENSE BIOGEL SZ 7.5 (GLOVE) ×2
GOWN STRL REUS W/TWL LRG LVL3 (GOWN DISPOSABLE) ×6 IMPLANT
INSERT HUMERAL MED 39/ +3 (Shoulder) IMPLANT
INSERT MEDIUM HUMERAL 39/ +3 (Shoulder) ×2 IMPLANT
KIT BASIN OR (CUSTOM PROCEDURE TRAY) ×3 IMPLANT
KIT TURNOVER KIT A (KITS) IMPLANT
MANIFOLD NEPTUNE II (INSTRUMENTS) ×3 IMPLANT
NDL TAPERED W/ NITINOL LOOP (MISCELLANEOUS) ×1 IMPLANT
NEEDLE TAPERED W/ NITINOL LOOP (MISCELLANEOUS) ×3 IMPLANT
NS IRRIG 1000ML POUR BTL (IV SOLUTION) ×3 IMPLANT
PACK SHOULDER (CUSTOM PROCEDURE TRAY) ×3 IMPLANT
PAD ARMBOARD 7.5X6 YLW CONV (MISCELLANEOUS) ×3 IMPLANT
PAD COLD SHLDR WRAP-ON (PAD) ×2 IMPLANT
PIN SET MODULAR GLENOID SYSTEM (PIN) ×2 IMPLANT
PROTECTOR NERVE ULNAR (MISCELLANEOUS) ×3 IMPLANT
RESTRAINT HEAD UNIVERSAL NS (MISCELLANEOUS) ×3 IMPLANT
SCREW CENTRAL MOD 30MM (Screw) ×2 IMPLANT
SCREW PERI LOCK 5.5X16 (Screw) ×2 IMPLANT
SCREW PERI LOCK 5.5X36 (Screw) ×2 IMPLANT
SCREW PERIPHERAL 5.5X20 LOCK (Screw) ×2 IMPLANT
SCREW PERIPHERAL 5.5X28 LOCK (Screw) ×2 IMPLANT
SLING ARM FOAM STRAP LRG (SOFTGOODS) ×2 IMPLANT
SLING ARM FOAM STRAP MED (SOFTGOODS) IMPLANT
SPACER REVERSE UNI 39/ +6MM (Shoulder) ×1 IMPLANT
SPACER SHLD UNI REV 39 +6 (Shoulder) ×1 IMPLANT
SPONGE LAP 18X18 RF (DISPOSABLE) IMPLANT
STEM HUMERAL UNIVER REV SIZE 7 (Stem) ×2 IMPLANT
SUCTION FRAZIER HANDLE 10FR (MISCELLANEOUS) ×4
SUCTION FRAZIER HANDLE 12FR (TUBING) ×2
SUCTION TUBE FRAZIER 10FR DISP (MISCELLANEOUS) IMPLANT
SUCTION TUBE FRAZIER 12FR DISP (TUBING) ×1 IMPLANT
SUT MNCRL AB 3-0 PS2 18 (SUTURE) ×3 IMPLANT
SUT MON AB 2-0 CT1 36 (SUTURE) ×3 IMPLANT
SUT VIC AB 1 CT1 36 (SUTURE) ×3 IMPLANT
SUTURE TAPE 1.3 40 TPR END (SUTURE) ×2 IMPLANT
SUTURETAPE 1.3 40 TPR END (SUTURE) ×6
TOWEL OR 17X26 10 PK STRL BLUE (TOWEL DISPOSABLE) ×3 IMPLANT
TOWEL OR NON WOVEN STRL DISP B (DISPOSABLE) ×3 IMPLANT
UNIVERS REVERS HUMERAL STEM (Orthopedic Implant) ×2 IMPLANT
WATER STERILE IRR 1000ML POUR (IV SOLUTION) ×6 IMPLANT

## 2019-06-13 NOTE — Progress Notes (Signed)
Pt unsure if he want to utilize CPAP QHS.  Machine ready at bedside if pt decides to wear.  RT to monitor and assess as needed.

## 2019-06-13 NOTE — Progress Notes (Signed)
Two bags taken with patient upstairs. Daughter updated and room number given.

## 2019-06-13 NOTE — Anesthesia Procedure Notes (Signed)
Anesthesia Regional Block: Interscalene brachial plexus block   Pre-Anesthetic Checklist: ,, timeout performed, Correct Patient, Correct Site, Correct Laterality, Correct Procedure, Correct Position, site marked, Risks and benefits discussed,  Surgical consent,  Pre-op evaluation,  At surgeon's request and post-op pain management  Laterality: Left  Prep: chloraprep       Needles:  Injection technique: Single-shot  Needle Type: Echogenic Stimulator Needle     Needle Length: 9cm  Needle Gauge: 21     Additional Needles:   Procedures:,,,, ultrasound used (permanent image in chart),,,,  Narrative:  Start time: 06/13/2019 7:05 AM End time: 06/13/2019 7:15 AM Injection made incrementally with aspirations every 5 mL.  Performed by: Personally  Anesthesiologist: Murvin Natal, MD  Additional Notes: Functioning IV was confirmed and monitors were applied.  A timeout was performed. Sterile prep, hand hygiene and sterile gloves were used. A 3mm 21ga Arrow echogenic stimulator needle was used. Negative aspiration and negative test dose prior to incremental administration of local anesthetic. The patient tolerated the procedure well.  Ultrasound guidance: relevent anatomy identified, needle position confirmed, local anesthetic spread visualized around nerve(s), vascular puncture avoided.  Image printed for medical record.

## 2019-06-13 NOTE — H&P (Signed)
Cole Walker    Chief Complaint: left shoulder osteoarthritis HPI: The patient is a 78 y.o. male with progressively increasing left shoulder pain and functional limitations related to end stage left shoulder rotator cuff tear arthropathy  Past Medical History:  Diagnosis Date  . Chronic kidney disease   . Coronary artery disease   . Deep vein blood clot of left lower extremity ([place]) 2017  . Heart disease   . Hyperlipidemia   . Pulmonary emboli ([place]) 2017  . Sleep apnea     Past Surgical History:  Procedure Laterality Date  . CORONARY ARTERY BYPASS GRAFT  2017  . PROSTATE SURGERY  2017  . TOTAL SHOULDER REPLACEMENT  2018    Family History  Problem Relation Age of Onset  . Heart attack Mother   . Hypertension Mother   . Diabetes Mother   . Stroke Sister   . Cancer Brother     Social History:  reports that he has never smoked. He has never used smokeless tobacco. He reports previous alcohol use. He reports that he does not use drugs.   Medications Prior to Admission  Medication Sig Dispense Refill  . aspirin 81 MG chewable tablet Chew 81 mg by mouth daily.    . Biotin 5000 MCG CAPS Take 5,000 mcg by mouth daily.     . calcium carbonate (TUMS - DOSED IN MG ELEMENTAL CALCIUM) 500 MG chewable tablet Chew 500 mg by mouth daily.     . Cholecalciferol (VITAMIN D) 50 MCG (2000 UT) tablet Take 2,000 Units by mouth daily.     Marland Kitchen Cod Liver Oil 1000 MG CAPS Take 1,000 mg by mouth daily.     . cycloSPORINE (RESTASIS) 0.05 % ophthalmic emulsion Place 1 drop into both eyes 2 (two) times daily.     . finasteride (PROSCAR) 5 MG tablet Take 5 mg by mouth daily.    . folic acid (FOLVITE) 706 MCG tablet Take 400 mcg by mouth daily.    . Multiple Vitamin (MULTIVITAMIN WITH MINERALS) TABS tablet Take 1 tablet by mouth daily.    . polyethylene glycol (MIRALAX) packet Take 17 g by mouth daily.    . pravastatin (PRAVACHOL) 40 MG tablet Take 40 mg by mouth daily.    . vitamin B-12 (CYANOCOBALAMIN)  250 MCG tablet Take 250 mcg by mouth daily.        Physical Exam: Left shoulder demonstrates a painful and guarded range of motion as noted at recent office visits.  Plain radiographs confirm changes consistent with end stage rotator cuff tear arthropathy  Vitals  Temp:  [97.8 F (36.6 C)] 97.8 F (36.6 C) (07/09 0539) Pulse Rate:  [66] 66 (07/09 0539) Resp:  [16] 16 (07/09 0539) BP: (126)/(71) 126/71 (07/09 0539) SpO2:  [98 %] 98 % (07/09 0539)  Assessment/Plan  Impression: left shoulder osteoarthritis  Plan of Action: Procedure(s): REVERSE SHOULDER ARTHROPLASTY  Sitara Cashwell M Bartholomew Ramesh 06/13/2019, 6:48 AM Contact # [phone]

## 2019-06-13 NOTE — Anesthesia Procedure Notes (Signed)
Procedure Name: Intubation Date/Time: 06/13/2019 7:46 AM Performed by: Montel Clock, CRNA Pre-anesthesia Checklist: Patient identified, Emergency Drugs available, Suction available, Patient being monitored and Timeout performed Patient Re-evaluated:Patient Re-evaluated prior to induction Oxygen Delivery Method: Circle system utilized Preoxygenation: Pre-oxygenation with 100% oxygen Induction Type: IV induction Ventilation: Mask ventilation without difficulty Laryngoscope Size: Mac and 3 Grade View: Grade II Tube type: Oral Tube size: 7.5 mm Number of attempts: 1 Airway Equipment and Method: Stylet Placement Confirmation: ETT inserted through vocal cords under direct vision,  positive ETCO2 and breath sounds checked- equal and bilateral Secured at: 23 cm Tube secured with: Tape Dental Injury: Teeth and Oropharynx as per pre-operative assessment

## 2019-06-13 NOTE — Anesthesia Postprocedure Evaluation (Signed)
Anesthesia Post Note  Patient: Cole Walker  Procedure(s) Performed: REVERSE SHOULDER ARTHROPLASTY (Left )     Patient location during evaluation: PACU Anesthesia Type: Regional and General Level of consciousness: awake and alert Pain management: pain level controlled Vital Signs Assessment: post-procedure vital signs reviewed and stable Respiratory status: spontaneous breathing, nonlabored ventilation, respiratory function stable and patient connected to nasal cannula oxygen Cardiovascular status: blood pressure returned to baseline and stable Postop Assessment: no apparent nausea or vomiting Anesthetic complications: no    Last Vitals:  Vitals:   06/13/19 1441 06/13/19 1551  BP: 131/81 131/75  Pulse: 76 76  Resp: 16 16  Temp: 36.4 C (!) 36.4 C  SpO2: 100% 100%    Last Pain:  Vitals:   06/13/19 1551  TempSrc: Oral  PainSc:                  Ryan P Ellender

## 2019-06-13 NOTE — Op Note (Signed)
06/13/2019  9:49 AM  PATIENT:   Cole Walker  78 y.o. male  PRE-OPERATIVE DIAGNOSIS:  left shoulder osteoarthritis with severe glenoid retroversion and rotator cuff dysfunction  POST-OPERATIVE DIAGNOSIS: Same  PROCEDURE: Left shoulder reverse arthroplasty utilizing a press-fit size 7 Arthrex stem with a +6 spacer, +3 polyethylene insert, a small/+2 baseplate, and a 39/+4 glenosphere  SURGEON:  Clemon Devaul, Vania ReaKevin M. M.D.  ASSISTANTS: Ralene Batheracy Shuford, PA-C  ANESTHESIA:   General endotracheal and interscalene block with Exparel  EBL: 200 cc  SPECIMEN: None  Drains: None   PATIENT DISPOSITION:  PACU - hemodynamically stable.    PLAN OF CARE: Admit for overnight observation  Brief history:  Mr. Kathrin Greathouseolk has a long history of left shoulder pain with increasing functional mutations related to advanced glenohumeral arthritis with known rotator cuff dysfunction and radiographs confirming severe glenoid retroversion.  Due to his increasing pain and functional imitations he is brought to the operating this time for planned left shoulder reverse arthroplasty  Preoperatively I counseled Mr. Ohlendorf regarding treatment options as well as the potential risks versus benefits thereof.  Possible surgical complications were reviewed including bleeding, infection, neurovascular injury, persistent pain, loss of motion, anesthetic complication, failure the implant, and possible need for additional surgery.  He understands and accepts and agrees with her planned procedure.  Procedure in detail:  After undergoing routine preop evaluation patient received prophylactic antibiotics and interscalene block with Exparel was established in the holding area by the anesthesia department.  Patient was placed spine on the operating table and underwent smooth induction of a general endotracheal anesthesia.  Placed into the beachchair position and appropriately padded and protected.  Left shoulder girdle region was then sterilely  prepped and draped in standard fashion.  Timeout was called.  An anterior deltopectoral approach left shoulder was made through 10 cm incision.  Skin flaps are elevated dissection carried deeply the deltopectoral interval was then developed from proximal to distal with the upper centimeter and half of the pectoralis major tenotomized for exposure conjoined tendon retracted medially adhesions divided beneath the deltoid.  The long head biceps tendon was then tenodesed at the upper border of the pectoralis major tendon and it was then unroofed and excised proximally and of note there was significant tenosynovitis and fluid accumulation about the proximal bicep tendon.  We then split the rotator cuff along the rotator interval and divided the subscapularis from its lesser tuberosity insertion and tagged it with a pair of suture tape sutures.  Capsular attachments were then divided from the anterior and inferior margins of the humeral neck allowing deliver the humeral head through the wound.  Extra medullary guide was then used to outline the proposed humeral head resection which was then performed with an oscillating saw and given the degree of bony deformity and retroversion a a generous humeral head resection was performed.  Peripheral osteophytes were removed with rondure and a metal cap placed over the cut proximal humeral surface.  We then exposed the glenoid with appropriate retractors and did note the significant retroversion.  Performed a circumferential labral resection gaining complete visualization of the periphery of the glenoid and given the retroversion we did make a significant correction to neutral position utilized first the central and peripheral reamers to correct the retroversion and then removed osteophytes anteriorly.  We then prepared the glenoid with our central drill and tap in the final construct with a 30 mm lag screw was then inserted into the glenoid with excellent fit and fixation.  We  then placed the peripheral locking screws all of which obtained excellent purchase and fixation.  The 39/+4 glenosphere was then impacted on the baseplate and the central locking screw was placed.  This point we returned attention back to the proximal humerus were the canal was prepared with hand reaming and then broached up to size 7 and the metaphysis was then reamed using a central reaming guide.  Trial reduction was performed showing good soft tissue balance and good stability.  Our final implant was then assembled it was then impacted in the humerus with excellent fit and fixation and trial reductions were then performed and again felt that final construct with a +6 spacer and a +3 poly-gave [place] the best soft tissue balance and good stability good motion.  The final spacer was inserted followed by the polyethylene and final reduction was then performed after the joint was copiously irrigated and hemostasis was obtained.  At this point the subscapularis was then repaired back to the anterior metaphysis utilizing the eyelets on the collar of our implant and once this was completed we confirmed the arm could easily be externally rotated 45 degrees without excessive tension on the subscapularis.  The wound was again irrigated and hemostasis was obtained.  The deltopectoral interval was reapproximated with a series of figure-of-eight #1 Vicryl sutures.  2-0 Monocryl used for the subcu layer and intracuticular 3 Monocryl for the skin followed by Dermabond and Aquasol dressing left arm was placed in a sling and the patient was awakened, extubated, taken recovery with stable condition.  Jenetta Loges, PA-C was used as an Environmental consultant throughout this case essential for help with positioning the patient, positioning of extremity, tissue manipulation, implantation of the prosthesis, wound closure, and intraoperative decision making.  Metta Clines Vashaun Osmon MD   Contact # [phone]

## 2019-06-13 NOTE — Discharge Instructions (Signed)
° °Kevin M. Supple, M.D., F.A.A.O.S. °Orthopaedic Surgery °Specializing in Arthroscopic and Reconstructive °Surgery of the Shoulder °[phone] °[address] - [place], [place] [address] - Fax [phone] ° ° °POST-OP TOTAL SHOULDER REPLACEMENT INSTRUCTIONS ° °1. Call the office at [phone] to schedule your first post-op appointment 10-14 days from the date of your surgery. ° °2. The bandage over your incision is waterproof. You may begin showering with this dressing on. You may leave this dressing on until first follow up appointment within 2 weeks. We prefer you leave this dressing in place until follow up however after 5-7 days if you are having itching or skin irritation and would like to remove it you may do so. Go slow and tug at the borders gently to break the bond the dressing has with the skin. At this point if there is no drainage it is okay to go without a bandage or you may cover it with a light guaze and tape. You can also expect significant bruising around your shoulder that will drift down your arm and into your chest wall. This is very normal and should resolve over several days. ° ° 3. Wear your sling/immobilizer at all times except to perform the exercises below or to occasionally let your arm dangle by your side to stretch your elbow. You also need to sleep in your sling immobilizer until instructed otherwise. It is ok to remove your sling if you are sitting in a controlled environment and allow your arm to rest in a position of comfort by your side or on your lap with pillows to give your neck and skin a break from the sling. You may remove it to allow arm to dangle by side to shower. If you are up walking around and when you go to sleep at night you need to wear it. ° °4. Range of motion to your elbow, wrist, and hand are encouraged 3-5 times daily. Exercise to your hand and fingers helps to reduce swelling you may experience. ° °5. Utilize ice to the shoulder 3-5 times  minimum a day and additionally if you are experiencing pain. ° °6. Prescriptions for a pain medication and a muscle relaxant are provided for you. It is recommended that if you are experiencing pain that you pain medication alone is not controlling, add the muscle relaxant along with the pain medication which can give additional pain relief. The first 1-2 days is generally the most severe of your pain and then should gradually decrease. As your pain lessens it is recommended that you decrease your use of the pain medications to an "as needed basis'" only and to always comply with the recommended dosages of the pain medications. ° °7. Pain medications can produce constipation along with their use. If you experience this, the use of an over the counter stool softener or laxative daily is recommended.  ° °8. For additional questions or concerns, please do not hesitate to call the office. If after hours there is an answering service to forward your concerns to the physician on call. ° °9.Pain control following an exparel block ° °To help control your post-operative pain you received a nerve block  performed with Exparel which is a long acting anesthetic (numbing agent) which can provide pain relief and sensations of numbness (and relief of pain) in the operative shoulder and arm for up to 3 days. Sometimes it provides mixed relief, meaning you may still have numbness in certain areas of the arm but can still   be able to move  parts of that arm, hand, and fingers. We recommend that your prescribed pain medications  be used as needed. We do not feel it is necessary to "pre medicate" and "stay ahead" of pain.  Taking narcotic pain medications when you are not having any pain can lead to unnecessary and potentially dangerous side effects.    10. Use the ice machine as much as possible in the first 5-7 days from surgery, then you can wean its use to as needed. The ice typically needs to be replaced every 6 hours, instead of  ice you can actually freeze water bottles to put in the cooler and then fill water around them to avoid having to purchase ice. You can have spare water bottles freezing to allow you to rotate them once they have melted. Try to have a thin shirt or light cloth or towel under the ice wrap to protect your skin.   POST-OP EXERCISES  Pendulum Exercises  Perform pendulum exercises while standing and bending at the waist. Support your uninvolved arm on a table or chair and allow your operated arm to hang freely. Make sure to do these exercises passively - not using you shoulder muscles.  Repeat 20 times. Do 3 sessions per day.    ===================================================================================================================  Information on my medicine - XARELTO (Rivaroxaban)    WHY WAS XARELTO PRESCRIBED FOR YOU?  Xarelto was prescribed for you to reduce the risk of blood clots forming after orthopedic surgery. The medical term for these abnormal blood clots is venous thromboembolism (VTE).    WHAT DO YOU NEED TO KNOW ABOUT XARELTO?  Take your Xarelto ONCE DAILY at the same time every day. You may take it either with or without food.    If you have difficulty swallowing the tablet whole, you may crush it and mix in applesauce just prior to taking your dose.    Take Xarelto exactly as prescribed by your doctor and DO NOT stop taking Xarelto without talking to the doctor who prescribed the medication. Stopping without other VTE prevention medication to take the place of Xarelto may increase your risk of developing a clot.    After discharge, you should have regular check-up appointments with your healthcare provider that is prescribing your Xarelto.     WHAT DO YOU DO IF YOU MISS A DOSE?  If you miss a dose, take it as soon as you remember on the same day then continue your regularly scheduled once daily regimen the next day. Do not take two doses of Xarelto on the  same day.     IMPORTANT SAFETY INFORMATION  A possible side effect of Xarelto is bleeding. You should call your healthcare provider right away if you experience any of the following:  ? Bleeding from an injury or your nose that does not stop.  ? Unusual colored urine (red or dark brown) or unusual colored stools (red or black).  Unusual bruising for unknown reasons.  ? A serious fall or if you hit your head (even if there is no bleeding).    Some medicines may interact with Xarelto and might increase your risk of bleeding while on Xarelto. To help avoid this, consult your healthcare provider or pharmacist prior to using any new prescription or non-prescription medications, including herbals, vitamins, non-steroidal antiinflammatory drugs (NSAIDs) and supplements.    This website has more information on Xarelto: [email or web address]

## 2019-06-13 NOTE — Transfer of Care (Signed)
Immediate Anesthesia Transfer of Care Note  Patient: Masud Holub  Procedure(s) Performed: REVERSE SHOULDER ARTHROPLASTY (Left )  Patient Location: PACU  Anesthesia Type:GA combined with regional for post-op pain  Level of Consciousness: drowsy and patient cooperative  Airway & Oxygen Therapy: Patient Spontanous Breathing and Patient connected to face mask oxygen  Post-op Assessment: Report given to RN and Post -op Vital signs reviewed and stable  Post vital signs: Reviewed and stable  Last Vitals:  Vitals Value Taken Time  BP    Temp    Pulse 67 06/13/19 0942  Resp 18 06/13/19 0942  SpO2 100 % 06/13/19 0942  Vitals shown include unvalidated device data.  Last Pain:  Vitals:   06/13/19 0539  TempSrc: Oral         Complications: No apparent anesthesia complications

## 2019-06-14 ENCOUNTER — Encounter (HOSPITAL_COMMUNITY): Payer: Self-pay | Admitting: Orthopedic Surgery

## 2019-06-14 MED ORDER — CYCLOBENZAPRINE HCL 10 MG PO TABS: 10.0000 mg | ORAL_TABLET | Freq: Three times a day (TID) | ORAL | 1 refills | Status: DC | PRN

## 2019-06-14 MED ORDER — TRAMADOL HCL 50 MG PO TABS: 50.0000 mg | ORAL_TABLET | Freq: Four times a day (QID) | ORAL | 0 refills | Status: DC | PRN

## 2019-06-14 MED ORDER — OXYCODONE-ACETAMINOPHEN 5-325 MG PO TABS: 1.0000 | ORAL_TABLET | ORAL | 0 refills | Status: DC | PRN

## 2019-06-14 MED ORDER — RIVAROXABAN 10 MG PO TABS: 10.0000 mg | ORAL_TABLET | Freq: Every day | ORAL | 0 refills | Status: DC

## 2019-06-14 NOTE — Discharge Summary (Signed)
PATIENT ID:      Cole Walker  MRN:     [id number] DOB/AGE:    07/05/1941 / 78 y.o.     DISCHARGE SUMMARY  ADMISSION DATE:    06/13/2019 DISCHARGE DATE:    ADMISSION DIAGNOSIS: left shoulder osteoarthritis Past Medical History:  Diagnosis Date  . Chronic kidney disease   . Coronary artery disease   . Deep vein blood clot of left lower extremity ([facility]) 2017  . Heart disease   . Hyperlipidemia   . Pulmonary emboli ([facility]) 2017  . Sleep apnea     DISCHARGE DIAGNOSIS:   Active Problems:   S/P reverse total shoulder arthroplasty, left   PROCEDURE: Procedure(s): REVERSE SHOULDER ARTHROPLASTY on 06/13/2019  CONSULTS:    HISTORY:  See H&P in chart.  HOSPITAL COURSE:  Cole Walker is a 78 y.o. admitted on 06/13/2019 with a diagnosis of left shoulder osteoarthritis.  They were brought to the operating room on 06/13/2019 and underwent Procedure(s): REVERSE SHOULDER ARTHROPLASTY.    They were given perioperative antibiotics:  Anti-infectives (From admission, onward)   Start     Dose/Rate Route Frequency Ordered Stop   06/13/19 0600  ceFAZolin (ANCEF) IVPB 2g/100 mL premix     2 g 200 mL/hr over 30 Minutes Intravenous On call to O.R. 06/13/19 [id number] 06/13/19 0752    .  Patient underwent the above named procedure and tolerated it well. The following day they were hemodynamically stable and pain was controlled on oral analgesics. They were neurovascularly intact to the operative extremity. OT was ordered and worked with patient per protocol. They were medically and orthopaedically stable for discharge on .    DIAGNOSTIC STUDIES:  RECENT RADIOGRAPHIC STUDIES :  No results found.  RECENT VITAL SIGNS:   Patient Vitals for the past 24 hrs:  BP Temp Temp src Pulse Resp SpO2 Height Weight  06/14/19 0453 127/68 97.6 F (36.4 C) - 67 16 100 % - -  06/13/19 2100 134/80 98.4 F (36.9 C) Oral 85 18 97 % - -  06/13/19 1551 131/75 (!) 97.5 F (36.4 C) Oral 76 16 100 % - -  06/13/19 1441 131/81 97.6 F  (36.4 C) Oral 76 16 100 % - -  06/13/19 1244 132/85 97.8 F (36.6 C) Oral 72 16 100 % 5\' 7"  (1.702 m) 79.6 kg  06/13/19 1200 128/74 97.8 F (36.6 C) - 70 14 96 % - -  06/13/19 1100 105/89 - - 73 18 97 % - -  06/13/19 1045 130/72 - - 68 16 100 % - -  06/13/19 1030 126/72 - - 66 15 99 % - -  06/13/19 1015 130/76 - - 68 18 100 % - -  06/13/19 1000 134/71 - - 68 16 99 % - -  06/13/19 0945 (!) 151/75 - - 66 14 100 % - -  06/13/19 0942 (!) 163/77 98 F (36.7 C) - 69 17 100 % - -  .  RECENT EKG RESULTS:    Orders placed or performed in visit on 01/10/19  . EKG 12-Lead    DISCHARGE INSTRUCTIONS:    DISCHARGE MEDICATIONS:   Allergies as of 06/14/2019      Reactions   Sulfa Antibiotics Nausea Only   GI Upset       Medication List    TAKE these medications   aspirin 81 MG chewable tablet Chew 81 mg by mouth daily.   Biotin 5000 MCG Caps Take 5,000 mcg by mouth daily.   calcium carbonate  500 MG chewable tablet Commonly known as: TUMS - dosed in mg elemental calcium Chew 500 mg by mouth daily.   Cod Liver Oil 1000 MG Caps Take 1,000 mg by mouth daily.   cyclobenzaprine 10 MG tablet Commonly known as: FLEXERIL Take 1 tablet (10 mg total) by mouth 3 (three) times daily as needed for muscle spasms.   cycloSPORINE 0.05 % ophthalmic emulsion Commonly known as: RESTASIS Place 1 drop into both eyes 2 (two) times daily.   finasteride 5 MG tablet Commonly known as: PROSCAR Take 5 mg by mouth daily.   folic acid 440 MCG tablet Commonly known as: FOLVITE Take 400 mcg by mouth daily.   MiraLax 17 g packet Generic drug: polyethylene glycol Take 17 g by mouth daily.   multivitamin with minerals Tabs tablet Take 1 tablet by mouth daily.   oxyCODONE-acetaminophen 5-325 MG tablet Commonly known as: Percocet Take 1 tablet by mouth every 4 (four) hours as needed (max 6 qd, for severe pain only).   pravastatin 40 MG tablet Commonly known as: PRAVACHOL Take 40 mg by mouth  daily.   rivaroxaban 10 MG Tabs tablet Commonly known as: Xarelto Take 1 tablet (10 mg total) by mouth daily.   traMADol 50 MG tablet Commonly known as: ULTRAM Take 1 tablet (50 mg total) by mouth every 6 (six) hours as needed for moderate pain (for mild to moderate pain).   vitamin B-12 250 MCG tablet Commonly known as: CYANOCOBALAMIN Take 250 mcg by mouth daily.   Vitamin D 50 MCG (2000 UT) tablet Take 2,000 Units by mouth daily.       FOLLOW UP VISIT:   Follow-up Information    Justice Britain, MD.   Specialty: Orthopedic Surgery Why: call to be seen in 10-14 days Contact information: [address] [place] [address] [phone]           DISCHARGE TO: Home   DISCHARGE CONDITION:  Thereasa Parkin Clarita Mcelvain for Dr. Justice Britain 06/14/2019, 8:32 AM

## 2019-06-14 NOTE — Evaluation (Addendum)
Occupational Therapy Evaluation Patient Details Name: Cole NewcomerWayne Buss MRN: [id number] DOB: 10/08/1941 Today's Date: 06/14/2019    History of Present Illness Pt is a 78 y/o male now s/p reverse L TSA   Clinical Impression   This 78 y/o male presents with the above. PTA pt reports independence with ADL, iADL and functional mobility. Pt presenting with deficits listed below. Pt currently requires modA for UB, min-modA for LB ADL secondary to LUE deficits at this time. Educated pt in shoulder precautions, sling management, HEP, safety and compensatory techniques for performing ADL and functional transfers after return home with pt verbalizing/return demonstrating understanding, cues provided throughout for maintaining precautions during functional tasks. Pt overall performing mobility in room with minguard assist without AD, though with x2 episodes of notable LOB requiring minguard-minA to correct. Given pt mildly off balance educated on having his daughter present initially when performing ADL and mobility at home for added safety, pt verbalizing understanding. Reports he lives with daughter who is planning to be home the first week to assist PRN. Questions answered throughout session; recommend pt follow up with therapies after discharge as recommended per MD. Pt anticipating d/c home today.     Follow Up Recommendations  Follow surgeon's recommendation for DC plan and follow-up therapies;Supervision/Assistance - 24 hour    Equipment Recommendations  Other (comment)(discussed option of shower chair, pt reports can purchse)           Precautions / Restrictions Precautions Precautions: Shoulder Shoulder Interventions: Shoulder sling/immobilizer;At all times;Off for dressing/bathing/exercises(okay to remove in controlled environment, sleep in sling ) Precaution Booklet Issued: Yes (comment) Precaution Comments: issued and reviewed shoulder precautions in depth Required Braces or Orthoses:  Sling Restrictions Weight Bearing Restrictions: Yes LUE Weight Bearing: Non weight bearing      Mobility Bed Mobility Overal bed mobility: Needs Assistance Bed Mobility: Supine to Sit     Supine to sit: Supervision     General bed mobility comments: for safety  Transfers Overall transfer level: Needs assistance Equipment used: None Transfers: Sit to/from Stand Sit to Stand: Min guard         General transfer comment: for balance, safety    Balance Overall balance assessment: Needs assistance Sitting-balance support: Feet supported Sitting balance-Leahy Scale: Good     Standing balance support: No upper extremity supported;During functional activity;Single extremity supported Standing balance-Leahy Scale: Fair Standing balance comment: pt with x2 episodes of LOB requiring minguard-minA to correct                           ADL either performed or assessed with clinical judgement   ADL Overall ADL's : Needs assistance/impaired Eating/Feeding: Set up;Sitting   Grooming: Set up;Sitting   Upper Body Bathing: Min guard;Sitting   Lower Body Bathing: Min guard;Sit to/from stand Lower Body Bathing Details (indicate cue type and reason): educated on benefits of performing bathing task in sitting initially vs standing for increased safety, pt verbalizing understanding Upper Body Dressing : Moderate assistance;Sitting Upper Body Dressing Details (indicate cue type and reason): for shirt, sling management, assist to thread sleeves over LUE and cues for proper technique Lower Body Dressing: Sit to/from stand;Moderate assistance Lower Body Dressing Details (indicate cue type and reason): assist to advance pants over hips, some assist for pants over feet but pt able to thread feet into underwear without assist; assist for socks/shoes Toilet Transfer: Min guard;Ambulation   Toileting- ArchitectClothing Manipulation and Hygiene: Min guard;Supervision/safety;Sit to/from  stand Toileting -  Clothing Manipulation Details (indicate cue type and reason): pt standing to void bladder in bathroom     Functional mobility during ADLs: Min guard General ADL Comments: educated in shoulder precautions, safety and compensatory techniques for performing ADL and functional transfers with pt verbalizing understanding. pt with x2 episodes of LOB requiring minguard-minA to correct - educated on having dtr present initially when performing ADL and mobility for added safety and pt verbalizing understanding      Vision         Perception     Praxis      Pertinent Vitals/Pain Pain Assessment: Faces Faces Pain Scale: Hurts little more Pain Location: L shoulder Pain Descriptors / Indicators: Discomfort;Sore Pain Intervention(s): Monitored during session;Repositioned     Hand Dominance Right   Extremity/Trunk Assessment Upper Extremity Assessment Upper Extremity Assessment: LUE deficits/detail LUE Deficits / Details: s/p reverse TSA LUE: Unable to fully assess due to immobilization LUE Coordination: decreased gross motor   Lower Extremity Assessment Lower Extremity Assessment: Overall WFL for tasks assessed   Cervical / Trunk Assessment Cervical / Trunk Assessment: Normal   Communication Communication Communication: No difficulties   Cognition Arousal/Alertness: Awake/alert Behavior During Therapy: WFL for tasks assessed/performed Overall Cognitive Status: Within Functional Limits for tasks assessed                                     General Comments       Exercises Exercises: General Upper Extremity;Hand exercises;Shoulder;Other exercises General Exercises - Upper Extremity Elbow Flexion: AROM;Left;10 reps;Seated Elbow Extension: AROM;Left;10 reps;Seated Wrist Flexion: AROM;Left;10 reps;Seated Wrist Extension: AROM;Left;10 reps;Seated Digit Composite Flexion: AROM;Left;10 reps;Seated Composite Extension: AROM;Left;10  reps;Seated Shoulder Exercises Pendulum Exercise: PROM;5 reps;Left;Standing Neck Flexion: AROM;Seated Neck Extension: AROM;Seated Neck Lateral Flexion - Right: AROM;Seated Neck Lateral Flexion - Left: AROM;Seated Hand Exercises Forearm Supination: AROM;Seated;Left;10 reps Forearm Pronation: AROM;Left;10 reps;Seated Other Exercises Other Exercises: lap slides, LUE, x10 seated   Shoulder Instructions Shoulder Instructions Donning/doffing shirt without moving shoulder: Minimal assistance Method for sponge bathing under operated UE: Min-guard Donning/doffing sling/immobilizer: Moderate assistance Correct positioning of sling/immobilizer: Minimal assistance Pendulum exercises (written home exercise program): Min-guard;Minimal assistance ROM for elbow, wrist and digits of operated UE: Supervision/safety Sling wearing schedule (on at all times/off for ADL's): Supervision/safety Proper positioning of operated UE when showering: Supervision/safety Positioning of UE while sleeping: Min-guard    Home Living Family/patient expects to be discharged to:: Private residence Living Arrangements: Children Available Help at Discharge: Family Type of Home: House Home Access: Stairs to enter Technical brewer of Steps: 2-3 Entrance Stairs-Rails: Right Home Layout: Two level Alternate Level Stairs-Number of Steps: flight  Alternate Level Stairs-Rails: Right;Left(one on each (two different flights in the home)) Bathroom Shower/Tub: Teacher, early years/pre: Standard     Home Equipment: None          Prior Functioning/Environment Level of Independence: Independent        Comments: driving        OT Problem List: Decreased strength;Decreased range of motion;Decreased activity tolerance;Impaired balance (sitting and/or standing);Decreased knowledge of precautions;Impaired UE functional use;Pain      OT Treatment/Interventions:      OT Goals(Current goals can be found  in the care plan section) Acute Rehab OT Goals Patient Stated Goal: home today OT Goal Formulation: All assessment and education complete, DC therapy  OT Frequency:     Barriers to D/C:  Co-evaluation              AM-PAC OT "6 Clicks" Daily Activity     Outcome Measure Help from another person eating meals?: A Little Help from another person taking care of personal grooming?: A Little Help from another person toileting, which includes using toliet, bedpan, or urinal?: A Little Help from another person bathing (including washing, rinsing, drying)?: A Little Help from another person to put on and taking off regular upper body clothing?: A Lot Help from another person to put on and taking off regular lower body clothing?: A Little 6 Click Score: 17   End of Session Equipment Utilized During Treatment: Other (comment)(sling) Nurse Communication: Mobility status  Activity Tolerance: Patient tolerated treatment well Patient left: in chair;with call bell/phone within reach  OT Visit Diagnosis: Unsteadiness on feet (R26.81);Pain Pain - Right/Left: Left Pain - part of body: Shoulder                Time: [id number] OT Time Calculation (min): 35 min Charges:  OT General Charges $OT Visit: 1 Visit OT Evaluation $OT Eval Moderate Complexity: 1 Mod OT Treatments $Self Care/Home Management : 8-22 mins  Marcy SirenBreanna Jasma Seevers, OT Supplemental Rehabilitation Services Pager [phone] Office [phone]   Orlando PennerBreanna L Jhordyn Hoopingarner 06/14/2019, 12:22 PM

## 2019-12-25 ENCOUNTER — Ambulatory Visit: Payer: Medicare Other | Attending: Internal Medicine

## 2019-12-25 DIAGNOSIS — Z23 Encounter for immunization: Secondary | ICD-10-CM

## 2019-12-25 NOTE — Progress Notes (Signed)
   Covid-19 Vaccination Clinic  Name:  Daisy Mcneel    MRN: [id number] DOB: [birth year]  12/25/2019  Mr. Zebrowski was observed post Covid-19 immunization for 15 minutes without incidence. He was provided with Vaccine Information Sheet and instruction to access the V-Safe system.   Mr. Hoefle was instructed to call 911 with any severe reactions post vaccine: Marland Kitchen Difficulty breathing  . Swelling of your face and throat  . A fast heartbeat  . A bad rash all over your body  . Dizziness and weakness    Immunizations Administered    Name Date Dose VIS Date Route   Pfizer COVID-19 Vaccine 12/25/2019  8:15 AM 0.3 mL 11/15/2019 Intramuscular   Manufacturer: [employer], [employer]   Lot: [id number]   NDC: [address]-3

## 2020-01-09 ENCOUNTER — Other Ambulatory Visit: Payer: Self-pay

## 2020-01-09 ENCOUNTER — Ambulatory Visit (HOSPITAL_COMMUNITY): Payer: Medicare Other | Attending: Cardiology

## 2020-01-09 DIAGNOSIS — N189 Chronic kidney disease, unspecified: Secondary | ICD-10-CM | POA: Insufficient documentation

## 2020-01-09 DIAGNOSIS — G4730 Sleep apnea, unspecified: Secondary | ICD-10-CM | POA: Insufficient documentation

## 2020-01-09 DIAGNOSIS — E785 Hyperlipidemia, unspecified: Secondary | ICD-10-CM | POA: Diagnosis not present

## 2020-01-09 DIAGNOSIS — Z86718 Personal history of other venous thrombosis and embolism: Secondary | ICD-10-CM | POA: Insufficient documentation

## 2020-01-09 DIAGNOSIS — I083 Combined rheumatic disorders of mitral, aortic and tricuspid valves: Secondary | ICD-10-CM | POA: Insufficient documentation

## 2020-01-09 DIAGNOSIS — Z951 Presence of aortocoronary bypass graft: Secondary | ICD-10-CM | POA: Insufficient documentation

## 2020-01-09 DIAGNOSIS — I77810 Thoracic aortic ectasia: Secondary | ICD-10-CM | POA: Diagnosis not present

## 2020-01-12 ENCOUNTER — Ambulatory Visit: Payer: Medicare Other | Attending: Internal Medicine

## 2020-01-12 DIAGNOSIS — Z23 Encounter for immunization: Secondary | ICD-10-CM

## 2020-01-12 NOTE — Progress Notes (Signed)
   Covid-19 Vaccination Clinic  Name:  Cole Walker    MRN: [id number] DOB: 05-23-1941  01/12/2020  Cole Walker was observed post Covid-19 immunization for 15 minutes without incidence. He was provided with Vaccine Information Sheet and instruction to access the V-Safe system.   Cole Walker was instructed to call 911 with any severe reactions post vaccine: Marland Kitchen Difficulty breathing  . Swelling of your face and throat  . A fast heartbeat  . A bad rash all over your body  . Dizziness and weakness    Immunizations Administered    Name Date Dose VIS Date Route   Pfizer COVID-19 Vaccine 01/12/2020 11:04 AM 0.3 mL 11/15/2019 Intramuscular   Manufacturer: [employer], [employer]   Lot: [id number]   NDC: [address]-8

## 2020-02-04 ENCOUNTER — Encounter: Payer: Self-pay | Admitting: Internal Medicine

## 2020-02-04 ENCOUNTER — Telehealth (INDEPENDENT_AMBULATORY_CARE_PROVIDER_SITE_OTHER): Payer: Medicare Other | Admitting: Internal Medicine

## 2020-02-04 VITALS — BP 135/69 | HR 58 | Temp 96.0°F | Ht 68.0 in | Wt 172.0 lb

## 2020-02-04 DIAGNOSIS — E785 Hyperlipidemia, unspecified: Secondary | ICD-10-CM | POA: Diagnosis not present

## 2020-02-04 DIAGNOSIS — Z951 Presence of aortocoronary bypass graft: Secondary | ICD-10-CM

## 2020-02-04 DIAGNOSIS — I2510 Atherosclerotic heart disease of native coronary artery without angina pectoris: Secondary | ICD-10-CM | POA: Diagnosis not present

## 2020-02-04 DIAGNOSIS — Z96612 Presence of left artificial shoulder joint: Secondary | ICD-10-CM

## 2020-02-04 DIAGNOSIS — N189 Chronic kidney disease, unspecified: Secondary | ICD-10-CM

## 2020-02-04 DIAGNOSIS — G4733 Obstructive sleep apnea (adult) (pediatric): Secondary | ICD-10-CM | POA: Diagnosis not present

## 2020-02-04 DIAGNOSIS — I77810 Thoracic aortic ectasia: Secondary | ICD-10-CM

## 2020-02-04 DIAGNOSIS — E782 Mixed hyperlipidemia: Secondary | ICD-10-CM

## 2020-02-04 DIAGNOSIS — Z9989 Dependence on other enabling machines and devices: Secondary | ICD-10-CM

## 2020-02-04 NOTE — Patient Instructions (Signed)
Medication Instructions:  Your physician recommends that you continue on your current medications as directed. Please refer to the Current Medication list given to you today.  *If you need a refill on your cardiac medications before your next appointment, please call your pharmacy*   Follow-Up: At [facility], you and your health needs are our priority.  As part of our continuing mission to provide you with exceptional heart care, we have created designated Provider Care Teams.  These Care Teams include your primary Cardiologist (physician) and Advanced Practice Providers (APPs -  Physician Assistants and Nurse Practitioners) who all work together to provide you with the care you need, when you need it.  We recommend signing up for the patient portal called "MyChart".  Sign up information is provided on this After Visit Summary.  MyChart is used to connect with patients for Virtual Visits (Telemedicine).  Patients are able to view lab/test results, encounter notes, upcoming appointments, etc.  Non-urgent messages can be sent to your provider as well.   To learn more about what you can do with MyChart, go to [email or web address].    Your next appointment:   12 month(s)  The format for your next appointment:   In Person  Provider:   You may see K. Italy Hilty, MD or one of the following Advanced Practice Providers on your designated Care Team:    Azalee Course, PA-C  Micah Flesher, [place] or   Judy Pimple, [place]    Other Instructions Please call our office 2 months ahead of time to schedule your annual follow-up.

## 2020-02-04 NOTE — Progress Notes (Signed)
Virtual Visit via Telephone Note   This visit type was conducted due to national recommendations for restrictions regarding the COVID-19 Pandemic (e.g. social distancing) in an effort to limit this patient's exposure and mitigate transmission in our community.  Due to his co-morbid illnesses, this patient is at least at moderate risk for complications without adequate follow up.  This format is felt to be most appropriate for this patient at this time.  The patient did not have access to video technology/had technical difficulties with video requiring transitioning to audio format only (telephone).  All issues noted in this document were discussed and addressed.  No physical exam could be performed with this format.  Please refer to the patient's chart for his  consent to telehealth for [facility].   Evaluation Performed:  Telephone visit  Date:  02/04/2020   ID:  Cole Walker, DOB 04/18/1941, MRN [id number]  Patient Location:  [place] [address]  Provider location:   [address], [place], [place] [address]  PCP:  Cole Bowen, MD  Cardiologist:  No primary care provider on file. Electrophysiologist:  None   Chief Complaint:  No complaints  History of Present Illness:    Cole Walker is a 79 y.o. male who presents via audio/video conferencing for a telehealth visit today.  [name] returns today for follow-up.  This is a telephone visit.  He is done well over the past year.  He says he is now a few weeks out of his second COVID-19 vaccine shot.  He is interested in getting more physical activity and getting back to the gym.  He saw his primary care provider in December.  Labs show notably a decline in renal function.  Creatinine clearance was 34 with a creatinine of 1.7.  Otherwise his lipids are not bad.  Total cholesterol 157, LDL 86, HDL 54 and triglycerides 83.  PSA was 3.  He said he was trying to drink more water and and be more active.  He did  undergo an echocardiogram which showed normal LVEF and mild diastolic dysfunction.  He had mild TR and AI.  There was no evidence of aortic root dilatation, in fact the aortic root measured 3.7 cm.  This was previously reported to be enlarged.  The patient does not have symptoms concerning for COVID-19 infection (fever, chills, cough, or new SHORTNESS OF BREATH).    Prior CV studies:   The following studies were reviewed today:  Chart review Lab work  PMHx:  Past Medical History:  Diagnosis Date  . Chronic kidney disease   . Coronary artery disease   . Deep vein blood clot of left lower extremity ([place]) 2017  . Heart disease   . Hyperlipidemia   . Pulmonary emboli ([place]) 2017  . Sleep apnea     Past Surgical History:  Procedure Laterality Date  . CORONARY ARTERY BYPASS GRAFT  2017  . PROSTATE SURGERY  2017  . REVERSE SHOULDER ARTHROPLASTY Left 06/13/2019   Procedure: REVERSE SHOULDER ARTHROPLASTY;  Surgeon: Cole Britain, MD;  Location: [facility] ORS;  Service: Orthopedics;  Laterality: Left;  13min  . TOTAL SHOULDER REPLACEMENT  2018    FAMHx:  Family History  Problem Relation Age of Onset  . Heart attack Mother   . Hypertension Mother   . Diabetes Mother   . Stroke Sister   . Cancer Brother     SOCHx:   reports that he has never smoked. He has never used smokeless  tobacco. He reports previous alcohol use. He reports that he does not use drugs.  ALLERGIES:  Allergies  Allergen Reactions  . Sulfa Antibiotics Nausea Only    GI Upset     MEDS:  Current Meds  Medication Sig  . aspirin 81 MG chewable tablet Chew 81 mg by mouth daily.  . Biotin 5000 MCG CAPS Take 5,000 mcg by mouth daily.   . calcium carbonate (TUMS - DOSED IN MG ELEMENTAL CALCIUM) 500 MG chewable tablet Chew 500 mg by mouth daily.   . Cholecalciferol (VITAMIN D) 50 MCG (2000 UT) tablet Take 2,000 Units by mouth daily.   Marland Kitchen Cod Liver Oil 1000 MG CAPS Take 1,000 mg by mouth daily.   . cycloSPORINE  (RESTASIS) 0.05 % ophthalmic emulsion Place 1 drop into both eyes 2 (two) times daily.   . finasteride (PROSCAR) 5 MG tablet Take 5 mg by mouth daily.  . folic acid (FOLVITE) 400 MCG tablet Take 400 mcg by mouth daily.  . Multiple Vitamin (MULTIVITAMIN WITH MINERALS) TABS tablet Take 1 tablet by mouth daily.  . polyethylene glycol (MIRALAX) packet Take 17 g by mouth daily.  . pravastatin (PRAVACHOL) 40 MG tablet Take 40 mg by mouth daily.  . traMADol (ULTRAM) 50 MG tablet Take 1 tablet (50 mg total) by mouth every 6 (six) hours as needed for moderate pain (for mild to moderate pain).  . vitamin B-12 (CYANOCOBALAMIN) 250 MCG tablet Take 250 mcg by mouth daily.   . [DISCONTINUED] cyclobenzaprine (FLEXERIL) 10 MG tablet Take 1 tablet (10 mg total) by mouth 3 (three) times daily as needed for muscle spasms.  . [DISCONTINUED] oxyCODONE-acetaminophen (PERCOCET) 5-325 MG tablet Take 1 tablet by mouth every 4 (four) hours as needed (max 6 qd, for severe pain only).  . [DISCONTINUED] rivaroxaban (XARELTO) 10 MG TABS tablet Take 1 tablet (10 mg total) by mouth daily.     ROS: Pertinent items noted in HPI and remainder of comprehensive ROS otherwise negative.  Labs/Other Tests and Data Reviewed:    Recent Labs: 06/06/2019: BUN 24; Creatinine, Ser 1.49; Hemoglobin 14.0; Platelets 224; Potassium 4.2; Sodium 143   Recent Lipid Panel No results found for: CHOL, TRIG, HDL, CHOLHDL, LDLCALC, LDLDIRECT  Wt Readings from Last 3 Encounters:  02/04/20 172 lb (78 kg)  06/13/19 175 lb 7.8 oz (79.6 kg)  06/06/19 175 lb 6.4 oz (79.6 kg)     Exam:    Vital Signs:  BP 135/69   Pulse (!) 58   Temp (!) 96 F (35.6 C)   Ht 5\' 8"  (1.727 m)   Wt 172 lb (78 kg)   BMI 26.15 kg/m    Exam not performed due to telephone visit  ASSESSMENT & PLAN:    1. CAD with 99% LAD stenosis-status post LIMA to LAD (OPCAB)-2016, [place], [place] ( , MD) 2. ? PDA 3. OSA on CPAP 4. Hyperlipidemia 5. CKD 6. History of  dilated aortic root  [name] is doing well now 5 years after off-pump LIMA to LAD.  He is compliant with CPAP.  Lipids are reasonably well controlled however target LDL should be less than 70.  He is going to work on increased diet and exercise over the next 6 months.  His kidney function has declined a little.  He needs to continue to drink water and avoid nephrotoxic agents.  He does not appear to have a dilated aortic root on his echo.  Will keep an eye on this however not need to  follow it annually.  COVID-19 Education: The signs and symptoms of COVID-19 were discussed with the patient and how to seek care for testing (follow up with PCP or arrange E-visit).  The importance of social distancing was discussed today.  Patient Risk:   After full review of this patients clinical status, I feel that they are at least moderate risk at this time.  Time:   Today, I have spent 25 minutes with the patient with telehealth technology discussing coronary disease, dyslipidemia, OSA, chronic kidney disease and dilated aortic root.     Medication Adjustments/Labs and Tests Ordered: Current medicines are reviewed at length with the patient today.  Concerns regarding medicines are outlined above.   Tests Ordered: No orders of the defined types were placed in this encounter.   Medication Changes: No orders of the defined types were placed in this encounter.   Disposition:  in 1 year(s)  Chrystie Nose, MD, [facility], FACP  [place]  [facility]  Medical Director of the Advanced Lipid Disorders &  Cardiovascular Risk Reduction Clinic Diplomate of the American Board of Clinical Lipidology Attending Cardiologist  Direct Dial: [phone]  Fax: [phone]  Website:  [email or web address]  Chrystie Nose, MD  02/04/2020 8:28 AM

## 2020-10-09 ENCOUNTER — Other Ambulatory Visit: Payer: Self-pay

## 2020-10-09 ENCOUNTER — Encounter: Payer: Self-pay | Admitting: Pulmonary Disease

## 2020-10-09 ENCOUNTER — Ambulatory Visit (INDEPENDENT_AMBULATORY_CARE_PROVIDER_SITE_OTHER): Payer: Medicare Other | Admitting: Pulmonary Disease

## 2020-10-09 VITALS — BP 112/60 | HR 70 | Temp 97.3°F | Ht 68.0 in | Wt 176.6 lb

## 2020-10-09 DIAGNOSIS — G4733 Obstructive sleep apnea (adult) (pediatric): Secondary | ICD-10-CM

## 2020-10-09 DIAGNOSIS — Z9989 Dependence on other enabling machines and devices: Secondary | ICD-10-CM | POA: Diagnosis not present

## 2020-10-09 NOTE — Patient Instructions (Signed)
Obstructive sleep apnea Daytime sleepiness  Continue using your CPAP as your download currently reveals very adequate treatment  Caffeinated beverages during the day may help, caffeine pills may help  Call with any other significant concerns  Follow-up a year from now

## 2020-10-09 NOTE — Progress Notes (Signed)
Cole Walker    [id number]    [birth year]  Primary Care Physician:South, Jeannett Senior, MD  Referring Physician: Adrian Prince, MD [address] [place],  [place] [address]  Chief complaint:   Patient with a history of obstructive sleep apnea  HPI:  On auto CPAP 10-15 His recent sleep doctor retired and is in for routine follow-up  Machine is about a year old Feels machine works well Was having some issues with mask leak but this is getting better  He has some daytime sleepiness Usually drinks 2 caffeinated beverages in the morning  Usually not able to take naps during the day  Usually goes to bed between 8 and 9 PM Takes about half an hour to fall asleep about 3 awakenings Final wake up time about 5 AM  Weight has been stable  Initial sleep study was 15 years ago  Denies any other associated complaints at present  Outpatient Encounter Medications as of 10/09/2020  Medication Sig  . acetaminophen (TYLENOL) 650 MG CR tablet Take 650 mg by mouth every 8 (eight) hours as needed for pain.  Marland Kitchen aspirin 81 MG chewable tablet Chew 81 mg by mouth daily.  . Biotin 5000 MCG CAPS Take 5,000 mcg by mouth daily.   . calcium carbonate (TUMS - DOSED IN MG ELEMENTAL CALCIUM) 500 MG chewable tablet Chew 500 mg by mouth daily.   . Cholecalciferol (VITAMIN D) 50 MCG (2000 UT) tablet Take 2,000 Units by mouth daily.   Marland Kitchen Cod Liver Oil 1000 MG CAPS Take 1,000 mg by mouth daily.   . cycloSPORINE (RESTASIS) 0.05 % ophthalmic emulsion Place 1 drop into both eyes 2 (two) times daily.   . finasteride (PROSCAR) 5 MG tablet Take 5 mg by mouth daily.  . folic acid (FOLVITE) 400 MCG tablet Take 400 mcg by mouth daily.  . Multiple Vitamin (MULTIVITAMIN WITH MINERALS) TABS tablet Take 1 tablet by mouth daily.  . polyethylene glycol (MIRALAX) packet Take 17 g by mouth daily.  . pravastatin (PRAVACHOL) 40 MG tablet Take 40 mg by mouth daily.  . vitamin B-12 (CYANOCOBALAMIN) 250 MCG tablet Take 250 mcg  by mouth daily.   . [DISCONTINUED] traMADol (ULTRAM) 50 MG tablet Take 1 tablet (50 mg total) by mouth every 6 (six) hours as needed for moderate pain (for mild to moderate pain).   No facility-administered encounter medications on file as of 10/09/2020.    Allergies as of 10/09/2020 - Review Complete 10/09/2020  Allergen Reaction Noted  . Sulfa antibiotics Nausea Only 08/11/2006    Past Medical History:  Diagnosis Date  . Chronic kidney disease   . Coronary artery disease   . Deep vein blood clot of left lower extremity (HCC) 2017  . Heart disease   . Hyperlipidemia   . Pulmonary emboli (HCC) 2017  . Sleep apnea     Past Surgical History:  Procedure Laterality Date  . CORONARY ARTERY BYPASS GRAFT  2017  . PROSTATE SURGERY  2017  . REVERSE SHOULDER ARTHROPLASTY Left 06/13/2019   Procedure: REVERSE SHOULDER ARTHROPLASTY;  Surgeon: Francena Hanly, MD;  Location: [facility];  Service: Orthopedics;  Laterality: Left;   . TOTAL SHOULDER REPLACEMENT  2018    Family History  Problem Relation Age of Onset  . Heart attack Mother   . Hypertension Mother   . Diabetes Mother   . Stroke Sister   . Cancer Brother     Social History   Socioeconomic History  .  Marital status: Widowed    Spouse name: Not on file  . Number of children: Not on file  . Years of education: Not on file  . Highest education level: Not on file  Occupational History  . Not on file  Tobacco Use  . Smoking status: Never Smoker  . Smokeless tobacco: Never Used  Vaping Use  . Vaping Use: Never used  Substance and Sexual Activity  . Alcohol use: Not Currently  . Drug use: Never  . Sexual activity: Not on file  Other Topics Concern  . Not on file  Social History Narrative  . Not on file   Social Determinants of Health   Financial Resource Strain:   . Difficulty of Paying Living Expenses: Not on file  Food Insecurity:   . Worried About Programme researcher, broadcasting/film/video in the Last Year: Not on file  . Ran Out  of Food in the Last Year: Not on file  Transportation Needs:   . Lack of Transportation (Medical): Not on file  . Lack of Transportation (Non-Medical): Not on file  Physical Activity:   . Days of Exercise per Week: Not on file  . Minutes of Exercise per Session: Not on file  Stress:   . Feeling of Stress : Not on file  Social Connections:   . Frequency of Communication with Friends and Family: Not on file  . Frequency of Social Gatherings with Friends and Family: Not on file  . Attends Religious Services: Not on file  . Active Member of Clubs or Organizations: Not on file  . Attends Banker Meetings: Not on file  . Marital Status: Not on file  Intimate Partner Violence:   . Fear of Current or Ex-Partner: Not on file  . Emotionally Abused: Not on file  . Physically Abused: Not on file  . Sexually Abused: Not on file    Review of Systems  Constitutional: Positive for fatigue.  Respiratory: Positive for apnea.   Psychiatric/Behavioral: Positive for sleep disturbance.  All other systems reviewed and are negative.   Vitals:   10/09/20 1019  BP: 112/60  Pulse: 70  Temp: (!) 97.3 F (36.3 C)  SpO2: 98%     Physical Exam Constitutional:      Appearance: Normal appearance.  HENT:     Head: Normocephalic.     Nose: No congestion or rhinorrhea.     Mouth/Throat:     Mouth: Mucous membranes are moist.     Comments: Mallampati 3, crowded oropharynx Eyes:     General:        Right eye: No discharge.        Left eye: No discharge.     Pupils: Pupils are equal, round, and reactive to light.  Cardiovascular:     Rate and Rhythm: Normal rate and regular rhythm.     Heart sounds: No murmur heard.  No friction rub.  Pulmonary:     Effort: No respiratory distress.     Breath sounds: No stridor. No wheezing or rhonchi.  Musculoskeletal:     Cervical back: No rigidity or tenderness.  Neurological:     Mental Status: He is alert.  Psychiatric:        Mood and  Affect: Mood normal.    Results of the Epworth flowsheet 10/09/2020  Sitting and reading 3  Watching TV 3  Sitting, inactive in a public place (e.g. a theatre or a meeting) 0  As a passenger in a car for an  hour without a break 0  Lying down to rest in the afternoon when circumstances permit 0  Sitting and talking to someone 0  Sitting quietly after a lunch without alcohol 3  In a car, while stopped for a few minutes in traffic 0  Total score 9    Data Reviewed: Compliance data reviewed showing 100% compliance with CPAP CPAP set between 10 and 15 Residual AHI of 0.4 95 percentile pressure of 13.8 Occasional  mask leaks  Assessment:  Obstructive sleep apnea -Adequately treated with CPAP therapy -Excellent compliance with CPAP use  Excessive daytime sleepiness  Plan/Recommendations: Continue CPAP use as current  Caffeinated beverages may help  Prescribe stimulants is likely not a good idea which was fully discussed with patient  He does not feel he will be able to take naps during the day as well  Continue CPAP on a regular basis  Encouraged to call with any significant concerns  Follow-up in a year   Virl Diamond MD [facility] 10/09/2020, 10:40 AM  CC: Adrian Prince, MD

## 2021-02-16 ENCOUNTER — Ambulatory Visit (INDEPENDENT_AMBULATORY_CARE_PROVIDER_SITE_OTHER): Payer: Medicare Other | Admitting: Internal Medicine

## 2021-02-16 ENCOUNTER — Other Ambulatory Visit: Payer: Self-pay

## 2021-02-16 ENCOUNTER — Encounter: Payer: Self-pay | Admitting: Internal Medicine

## 2021-02-16 VITALS — BP 128/76 | HR 54 | Ht 68.0 in | Wt 180.8 lb

## 2021-02-16 DIAGNOSIS — I351 Nonrheumatic aortic (valve) insufficiency: Secondary | ICD-10-CM | POA: Diagnosis not present

## 2021-02-16 DIAGNOSIS — E782 Mixed hyperlipidemia: Secondary | ICD-10-CM | POA: Diagnosis not present

## 2021-02-16 DIAGNOSIS — Z951 Presence of aortocoronary bypass graft: Secondary | ICD-10-CM

## 2021-02-16 DIAGNOSIS — I358 Other nonrheumatic aortic valve disorders: Secondary | ICD-10-CM | POA: Diagnosis not present

## 2021-02-16 NOTE — Progress Notes (Signed)
OFFICE NOTE  Chief Complaint:  Establish cardiologist  Primary Care Physician: Cole Prince, MD  HPI:  Cole Walker is a 80 y.o. male with a past medial history significant for coronary artery disease with onset of shortness of breath in 2016.  Cardiac catheterization showed a 99% proximal LAD lesion and ultimately he underwent off pump CABG with LIMA to LAD by Dr. Merleen Walker. Through a left thoracotomy approach.  It is also mentioned that he has a patent ductus arteriosus -it is not clear to me that this was ligated at the time of surgery but may not have been due to the decreased exposure.  He has had some shortness of breath subsequent to this.  Apparently his presenting symptoms was shortness of breath, but he claims he never had chest pain.  More recently he underwent repeat stress testing in June 2019 which was negative for ischemia.  He also has a history of PE/DVT which occurred apparently after shoulder surgery.  He was anticoagulated on warfarin for 6 months and then discontinue that.  He recently established with Dr. Evlyn Walker.  Labs in January 2020 indicated total cholesterol 145, HDL 50, LDL 63 and triglycerides 162.  02/16/2021  Cole Walker returns today for follow-up.  He continues to do well.  Denies any chest pain or worsening shortness of breath.  He is physically active.  He had lipids last July.  Total cholesterol 136, HDL 57, triglycerides 87 LDL 62.  He has follow-up lab work this summer.  Blood pressures well controlled today 128/76.  He had an echocardiogram last year.  EF is 65 to 70%, there was aortic valve sclerosis with mild AI, mild left atrial enlargement, mild MR and mild TR.  The aortic root was not measured to be dilated.  PMHx:  Past Medical History:  Diagnosis Date  . Chronic kidney disease   . Coronary artery disease   . Deep vein blood clot of left lower extremity (HCC) 2017  . Heart disease   . Hyperlipidemia   . Pulmonary emboli (HCC) 2017  . Sleep apnea      Past Surgical History:  Procedure Laterality Date  . CORONARY ARTERY BYPASS GRAFT  2017  . PROSTATE SURGERY  2017  . REVERSE SHOULDER ARTHROPLASTY Left 06/13/2019   Procedure: REVERSE SHOULDER ARTHROPLASTY;  Surgeon: Cole Hanly, MD;  Location: [facility];  Service: Orthopedics;  Laterality: Left;   . TOTAL SHOULDER REPLACEMENT  2018    FAMHx:  Family History  Problem Relation Age of Onset  . Heart attack Mother   . Hypertension Mother   . Diabetes Mother   . Stroke Sister   . Cancer Brother     SOCHx:   reports that he has never smoked. He has never used smokeless tobacco. He reports previous alcohol use. He reports that he does not use drugs.  ALLERGIES:  Allergies  Allergen Reactions  . Sulfa Antibiotics Nausea Only    GI Upset     ROS: Pertinent items noted in HPI and remainder of comprehensive ROS otherwise negative.  HOME MEDS: Current Outpatient Medications on File Prior to Visit  Medication Sig Dispense Refill  . acetaminophen (TYLENOL) 650 MG CR tablet Take 650 mg by mouth every 8 (eight) hours as needed for pain.    Marland Kitchen aspirin 81 MG chewable tablet Chew 81 mg by mouth daily.    . Biotin 5000 MCG CAPS Take 5,000 mcg by mouth daily.     . calcium carbonate (TUMS -  DOSED IN MG ELEMENTAL CALCIUM) 500 MG chewable tablet Chew 500 mg by mouth daily.     . Cholecalciferol (VITAMIN D) 50 MCG (2000 UT) tablet Take 2,000 Units by mouth daily.     Marland Kitchen Cod Liver Oil 1000 MG CAPS Take 1,000 mg by mouth daily.     . cycloSPORINE (RESTASIS) 0.05 % ophthalmic emulsion Place 1 drop into both eyes 2 (two) times daily.     . finasteride (PROSCAR) 5 MG tablet Take 5 mg by mouth daily.    . folic acid (FOLVITE) 400 MCG tablet Take 400 mcg by mouth daily.    . Multiple Vitamin (MULTIVITAMIN WITH MINERALS) TABS tablet Take 1 tablet by mouth daily.    . polyethylene glycol (MIRALAX / GLYCOLAX) 17 g packet Take 17 g by mouth daily.    . pravastatin (PRAVACHOL) 40 MG tablet Take 40  mg by mouth daily.    . vitamin B-12 (CYANOCOBALAMIN) 250 MCG tablet Take 250 mcg by mouth daily.      No current facility-administered medications on file prior to visit.    LABS/IMAGING: No results found for this or any previous visit (from the past 48 hour(s)). No results found.  LIPID PANEL: No results found for: CHOL, TRIG, HDL, CHOLHDL, VLDL, LDLCALC, LDLDIRECT   WEIGHTS: Wt Readings from Last 3 Encounters:  02/16/21 180 lb 12.8 oz (82 kg)  10/09/20 176 lb 9.6 oz (80.1 kg)  02/04/20 172 lb (78 kg)    VITALS: BP 128/76   Pulse (!) 54   Ht 5\' 8"  (1.727 m)   Wt 180 lb 12.8 oz (82 kg)   BMI 27.49 kg/m   EXAM: General appearance: alert and no distress Neck: no carotid bruit, no JVD and thyroid not enlarged, symmetric, no tenderness/mass/nodules Lungs: clear to auscultation bilaterally Heart: systolic murmur: early systolic 2/6, buzzing at 2nd right intercostal space Abdomen: soft, non-tender; bowel sounds normal; no masses,  no organomegaly Extremities: extremities normal, atraumatic, no cyanosis or edema Pulses: 2+ and symmetric Skin: Skin color, texture, turgor normal. No rashes or lesions Neurologic: Grossly normal Psych: Pleasant  EKG: Sinus rhythm with first-degree AV block at 61 nonspecific ST changes- personally reviewed  ASSESSMENT: 1. CAD with 99% LAD stenosis-status post LIMA to LAD (OPCAB)-2016, [place], [place], MD) 2. ? PDA 3. OSA on CPAP 4. Hyperlipidemia 5. CKD 6. History of dilated aortic root -measured 3.7 cm (01/2020) 7. Aortic sclerosis/mild AI  PLAN: 1.   Cole Walker continues to do well without any chest pain or worsening shortness of breath.  He is compliant with CPAP.  His blood pressures well controlled.  He does have some aortic sclerosis and mild AI.  Cholesterol is at target with LDL less than 70.  No medication changes today.  Plan follow-up annually or sooner as necessary.  Cole Sites, MD, [facility], FACP  [place]  [facility]  [facility]  Medical Director of the [facility] Lipid [facility] &  Cardiovascular Risk Reduction Clinic Diplomate of the American Board of Clinical Lipidology Attending Cardiologist  Direct Dial: [phone]  Fax: [phone]  Website:  [email or web address] Hilty 02/16/2021, 8:15 AM

## 2021-02-16 NOTE — Patient Instructions (Signed)

## 2021-10-05 ENCOUNTER — Encounter: Payer: Self-pay | Admitting: Pulmonary Disease

## 2021-10-05 ENCOUNTER — Other Ambulatory Visit: Payer: Self-pay

## 2021-10-05 ENCOUNTER — Ambulatory Visit (INDEPENDENT_AMBULATORY_CARE_PROVIDER_SITE_OTHER): Payer: Medicare Other | Admitting: Pulmonary Disease

## 2021-10-05 VITALS — BP 114/70 | HR 57 | Temp 97.6°F | Ht 68.0 in | Wt 178.0 lb

## 2021-10-05 DIAGNOSIS — G4733 Obstructive sleep apnea (adult) (pediatric): Secondary | ICD-10-CM | POA: Diagnosis not present

## 2021-10-05 DIAGNOSIS — Z9989 Dependence on other enabling machines and devices: Secondary | ICD-10-CM

## 2021-10-05 NOTE — Patient Instructions (Signed)
Continue using CPAP on a regular basis  Cut down on water intake late into the evening  Call with significant concerns  I will see you a year from now

## 2021-10-05 NOTE — Progress Notes (Signed)
Cole Walker    [id number]    February 07, 1941  Primary Care Physician:South, Jeannett Senior, MD  Referring Physician: Adrian Prince, MD [address] [place],  [place] [address]  Chief complaint:   Patient with a history of obstructive sleep apnea  HPI:  Has been doing relatively well his health has been stable  Having some awakenings with lightheadedness -No new medications -No new health issues  On auto CPAP 10-15 Machine is working well  He is having some mask issues, feels it may be related to the strap-head care  He has some daytime sleepiness Usually drinks 2 caffeinated beverages in the morning  Usually not able to take naps during the day  Usually goes to bed between 8 and 9 PM Takes about half an hour to fall asleep about 3 awakenings Final wake up time about 5 AM  Weight has been stable  Initial sleep study was 15 years ago  Denies any other associated complaints at present  Outpatient Encounter Medications as of 10/09/2020  Medication Sig   acetaminophen (TYLENOL) 650 MG CR tablet Take 650 mg by mouth every 8 (eight) hours as needed for pain.   aspirin 81 MG chewable tablet Chew 81 mg by mouth daily.   Biotin 5000 MCG CAPS Take 5,000 mcg by mouth daily.    calcium carbonate (TUMS - DOSED IN MG ELEMENTAL CALCIUM) 500 MG chewable tablet Chew 500 mg by mouth daily.    Cholecalciferol (VITAMIN D) 50 MCG (2000 UT) tablet Take 2,000 Units by mouth daily.    Cod Liver Oil 1000 MG CAPS Take 1,000 mg by mouth daily.    cycloSPORINE (RESTASIS) 0.05 % ophthalmic emulsion Place 1 drop into both eyes 2 (two) times daily.    finasteride (PROSCAR) 5 MG tablet Take 5 mg by mouth daily.   folic acid (FOLVITE) 400 MCG tablet Take 400 mcg by mouth daily.   Multiple Vitamin (MULTIVITAMIN WITH MINERALS) TABS tablet Take 1 tablet by mouth daily.   polyethylene glycol (MIRALAX) packet Take 17 g by mouth daily.   pravastatin (PRAVACHOL) 40 MG tablet Take 40 mg by mouth daily.    vitamin B-12 (CYANOCOBALAMIN) 250 MCG tablet Take 250 mcg by mouth daily.    [DISCONTINUED] traMADol (ULTRAM) 50 MG tablet Take 1 tablet (50 mg total) by mouth every 6 (six) hours as needed for moderate pain (for mild to moderate pain).   No facility-administered encounter medications on file as of 10/09/2020.    Allergies as of 10/09/2020 - Review Complete 10/09/2020  Allergen Reaction Noted   Sulfa antibiotics Nausea Only 08/11/2006    Past Medical History:  Diagnosis Date   Chronic kidney disease    Coronary artery disease    Deep vein blood clot of left lower extremity (HCC) 2017   Heart disease    Hyperlipidemia    Pulmonary emboli (HCC) 2017   Sleep apnea     Past Surgical History:  Procedure Laterality Date   CORONARY ARTERY BYPASS GRAFT  2017   PROSTATE SURGERY  2017   REVERSE SHOULDER ARTHROPLASTY Left 06/13/2019   Procedure: REVERSE SHOULDER ARTHROPLASTY;  Surgeon: Francena Hanly, MD;  Location: [facility];  Service: Orthopedics;  Laterality: Left;    TOTAL SHOULDER REPLACEMENT  2018    Family History  Problem Relation Age of Onset   Heart attack Mother    Hypertension Mother    Diabetes Mother    Stroke Sister    Cancer Brother  Social History   Socioeconomic History   Marital status: Widowed    Spouse name: Not on file   Number of children: Not on file   Years of education: Not on file   Highest education level: Not on file  Occupational History   Not on file  Tobacco Use   Smoking status: Never Smoker   Smokeless tobacco: Never Used  Vaping Use   Vaping Use: Never used  Substance and Sexual Activity   Alcohol use: Not Currently   Drug use: Never   Sexual activity: Not on file  Other Topics Concern   Not on file  Social History Narrative   Not on file   Social Determinants of Health   Financial Resource Strain:    Difficulty of Paying Living Expenses: Not on file  Food Insecurity:    Worried About Running Out of Food in the Last Year:  Not on file   Ran Out of Food in the Last Year: Not on file  Transportation Needs:    Lack of Transportation (Medical): Not on file   Lack of Transportation (Non-Medical): Not on file  Physical Activity:    Days of Exercise per Week: Not on file   Minutes of Exercise per Session: Not on file  Stress:    Feeling of Stress : Not on file  Social Connections:    Frequency of Communication with Friends and Family: Not on file   Frequency of Social Gatherings with Friends and Family: Not on file   Attends Religious Services: Not on file   Active Member of Clubs or Organizations: Not on file   Attends Banker Meetings: Not on file   Marital Status: Not on file  Intimate Partner Violence:    Fear of Current or Ex-Partner: Not on file   Emotionally Abused: Not on file   Physically Abused: Not on file   Sexually Abused: Not on file    Review of Systems  Constitutional:  Positive for fatigue.  Respiratory:  Positive for apnea.   Psychiatric/Behavioral:  Positive for sleep disturbance.   All other systems reviewed and are negative.  Vitals:   10/09/20 1019  BP: 112/60  Pulse: 70  Temp: (!) 97.3 F (36.3 C)  SpO2: 98%     Physical Exam Constitutional:      Appearance: Normal appearance.  HENT:     Head: Normocephalic.     Nose: No congestion or rhinorrhea.     Mouth/Throat:     Comments: Mallampati 3, crowded oropharynx Eyes:     General:        Right eye: No discharge.        Left eye: No discharge.  Cardiovascular:     Rate and Rhythm: Normal rate and regular rhythm.     Heart sounds: No murmur heard.   No friction rub.  Pulmonary:     Effort: No respiratory distress.     Breath sounds: No stridor. No wheezing or rhonchi.  Musculoskeletal:     Cervical back: No rigidity or tenderness.  Neurological:     Mental Status: He is alert.  Psychiatric:        Mood and Affect: Mood normal.   Results of the Epworth flowsheet 10/09/2020  Sitting and reading 3   Watching TV 3  Sitting, inactive in a public place (e.g. a theatre or a meeting) 0  As a passenger in a car for an hour without a break 0  Lying down to rest in  the afternoon when circumstances permit 0  Sitting and talking to someone 0  Sitting quietly after a lunch without alcohol 3  In a car, while stopped for a few minutes in traffic 0  Total score 9    Data Reviewed: Compliance data reviewed showing 100% compliance with CPAP CPAP set between 10 and 15 Residual AHI of 0.3 95 percentile pressure of 12.7 Occasional  mask leaks  Assessment:  Obstructive sleep apnea -Adequately treated with CPAP therapy -Excellent compliance Continues to benefit from CPAP use  Excessive daytime sleepiness  Plan/Recommendations:  Continue CPAP use  Encouraged to call with any significant concerns  I will see him a year from now   Virl Diamond MD [facility] 10/09/2020, 10:40 AM  CC: Adrian Prince, MD

## 2022-04-22 ENCOUNTER — Ambulatory Visit (INDEPENDENT_AMBULATORY_CARE_PROVIDER_SITE_OTHER): Payer: Medicare Other | Admitting: Nurse Practitioner

## 2022-04-22 ENCOUNTER — Encounter: Payer: Self-pay | Admitting: Nurse Practitioner

## 2022-04-22 ENCOUNTER — Ambulatory Visit: Payer: Medicare Other | Admitting: Internal Medicine

## 2022-04-22 VITALS — BP 106/60 | HR 62 | Resp 20 | Ht 68.0 in | Wt 180.6 lb

## 2022-04-22 DIAGNOSIS — I340 Nonrheumatic mitral (valve) insufficiency: Secondary | ICD-10-CM

## 2022-04-22 DIAGNOSIS — I351 Nonrheumatic aortic (valve) insufficiency: Secondary | ICD-10-CM | POA: Diagnosis not present

## 2022-04-22 DIAGNOSIS — G4733 Obstructive sleep apnea (adult) (pediatric): Secondary | ICD-10-CM

## 2022-04-22 DIAGNOSIS — I2510 Atherosclerotic heart disease of native coronary artery without angina pectoris: Secondary | ICD-10-CM

## 2022-04-22 DIAGNOSIS — E782 Mixed hyperlipidemia: Secondary | ICD-10-CM

## 2022-04-22 DIAGNOSIS — N1831 Chronic kidney disease, stage 3a: Secondary | ICD-10-CM

## 2022-04-22 NOTE — Patient Instructions (Signed)
Medication Instructions:  ?Your physician recommends that you continue on your current medications as directed. Please refer to the Current Medication list given to you today.  ? ?*If you need a refill on your cardiac medications before your next appointment, please call your pharmacy* ? ? ?Lab Work: ?NONE ordered at this time of appointment  ? ?If you have labs (blood work) drawn today and your tests are completely normal, you will receive your results only by: ?MyChart Message (if you have MyChart) OR ?A paper copy in the mail ?If you have any lab test that is abnormal or we need to change your treatment, we will call you to review the results. ? ? ?Testing/Procedures: ?NONE ordered at this time of appointment  ? ? ? ?Follow-Up: ?At [facility], you and your health needs are our priority.  As part of our continuing mission to provide you with exceptional heart care, we have created designated Provider Care Teams.  These Care Teams include your primary Cardiologist (physician) and Advanced Practice Providers (APPs -  Physician Assistants and Nurse Practitioners) who all work together to provide you with the care you need, when you need it. ? ?We recommend signing up for the patient portal called "MyChart".  Sign up information is provided on this After Visit Summary.  MyChart is used to connect with patients for Virtual Visits (Telemedicine).  Patients are able to view lab/test results, encounter notes, upcoming appointments, etc.  Non-urgent messages can be sent to your provider as well.   ?To learn more about what you can do with MyChart, go to [email or web address].   ? ?Your next appointment:   ?1 year(s) ? ?The format for your next appointment:   ?In Person ? ?Provider:   ?Kenneth C Hilty, MD   ? ? ?Other Instructions ? ? ?Important Information About Sugar ? ? ? ? ? ? ?

## 2022-04-22 NOTE — Progress Notes (Signed)
Office Visit    Patient Name: Cole Walker Date of Encounter: 04/22/2022  Primary Care Provider:  Reynold Bowen, MD Primary Cardiologist:  Pixie Casino, MD  Chief Complaint    81 year old male with a history of CAD s/p CABG x1, PE/DVT, hyperlipidemia, OSA on CPAP, mild aortic valve regurgitation, mild mitral valve regurgitation, and CKD who presents for follow-up related to CAD.  Past Medical History    Past Medical History:  Diagnosis Date   Chronic kidney disease    Coronary artery disease    Deep vein blood clot of left lower extremity ([place]) 2017   Heart disease    Hyperlipidemia    Pulmonary emboli ([place]) 2017   Sleep apnea    Past Surgical History:  Procedure Laterality Date   CORONARY ARTERY BYPASS GRAFT  2017   PROSTATE SURGERY  2017   REVERSE SHOULDER ARTHROPLASTY Left 06/13/2019   Procedure: REVERSE SHOULDER ARTHROPLASTY;  Surgeon: Justice Britain, MD;  Location: [facility];  Service: Orthopedics;  Laterality: Left;  122min   TOTAL SHOULDER REPLACEMENT  2018    Allergies  Allergies  Allergen Reactions   Sulfa Antibiotics Nausea Only    GI Upset     History of Present Illness    81 year old male with the above past medical history including CAD s/p CABG x1, PE/DVT, hyperlipidemia, OSA on CPAP, mild aortic valve regurgitation, mild mitral valve regurgitation and CKD.  He has a history of CAD, with presenting symptom of shortness of breath in 2016.  Catheterization showed a 99% proximal LAD lesion and he ultimately underwent off-pump CABG x1 with LIMA to LAD in [place]. He was noted that he has a patent ductus arteriosus-it is not clear that this was ligated at the time of surgery but may not have been due to decreased exposure.  Stress test in June 2019 was negative for ischemia.  He has a history of PE/DVT which occurred after shoulder surgery.  He was anticoagulated on warfarin for 6 months, this was subsequently discontinued.  Echocardiogram in February 2021  showed EF 65 to 70%, mild LVH, G1 DD, mild mitral valve regurgitation, mild aortic valve regurgitation.   He was last seen in the office on 02/16/2021 and was doing well from a cardiac standpoint.  He denied any symptoms concerning for angina.  He presents today for follow-up.  Since his last visit been stable overall from a cardiac standpoint.  He has over the past year noted an increase in daytime sleepiness. He reports approximately 8 hours of restful sleep a night. He is adherent to his CPAP.  He denies any worsening shortness of breath, denies palpitations, or symptoms concerning for angina. Other than his increased sleepiness, he reports feeling well denies any additional concerns today.  Home Medications    Current Outpatient Medications  Medication Sig Dispense Refill   acetaminophen (TYLENOL) 650 MG CR tablet Take 650 mg by mouth every 8 (eight) hours as needed for pain.     aspirin 81 MG chewable tablet Chew 81 mg by mouth daily.     Biotin 5000 MCG CAPS Take 5,000 mcg by mouth daily.      calcium carbonate (TUMS - DOSED IN MG ELEMENTAL CALCIUM) 500 MG chewable tablet Chew 500 mg by mouth daily.      Cholecalciferol (VITAMIN D) 50 MCG (2000 UT) tablet Take 2,000 Units by mouth daily.      Cod Liver Oil 1000 MG CAPS Take 1,000 mg by mouth daily.  cycloSPORINE (RESTASIS) 0.05 % ophthalmic emulsion Place 1 drop into both eyes 2 (two) times daily.      finasteride (PROSCAR) 5 MG tablet Take 5 mg by mouth daily.     folic acid (FOLVITE) A999333 MCG tablet Take 400 mcg by mouth daily.     Multiple Vitamin (MULTIVITAMIN WITH MINERALS) TABS tablet Take 1 tablet by mouth daily.     polyethylene glycol (MIRALAX / GLYCOLAX) 17 g packet Take 17 g by mouth daily.     pravastatin (PRAVACHOL) 40 MG tablet Take 40 mg by mouth daily.     vitamin B-12 (CYANOCOBALAMIN) 250 MCG tablet Take 250 mcg by mouth daily.      No current facility-administered medications for this visit.     Review of Systems     He denies chest pain, palpitations, dyspnea, pnd, orthopnea, n, v, dizziness, syncope, edema, weight gain, or early satiety. All other systems reviewed and are otherwise negative except as noted above.   Physical Exam    VS:  BP 106/60 (BP Location: Right Arm, Patient Position: Sitting, Cuff Size: Normal)   Pulse 62   Resp 20   Ht 5\' 8"  (1.727 m)   Wt 180 lb 9.6 oz (81.9 kg)   SpO2 97%   BMI 27.46 kg/m  GEN: Well nourished, well developed, in no acute distress. HEENT: normal. Neck: Supple, no JVD, carotid bruits, or masses. Cardiac: RRR, no murmurs, rubs, or gallops. No clubbing, cyanosis, edema.  Radials/DP/PT 2+ and equal bilaterally.  Respiratory:  Respirations regular and unlabored, clear to auscultation bilaterally. GI: Soft, nontender, nondistended, BS + x 4. MS: no deformity or atrophy. Skin: warm and dry, no rash. Neuro:  Strength and sensation are intact. Psych: Normal affect.  Accessory Clinical Findings    ECG personally reviewed by me today - Sinus rhythm with 1st degree AV block, 62 bpm, nonspecific t wave changes - no acute changes.  Lab Results  Component Value Date   WBC 6.4 06/06/2019   HGB 14.0 06/06/2019   HCT 43.2 06/06/2019   MCV 93.3 06/06/2019   PLT 224 06/06/2019   Lab Results  Component Value Date   CREATININE 1.49 (H) 06/06/2019   BUN 24 (H) 06/06/2019   NA 143 06/06/2019   K 4.2 06/06/2019   CL 106 06/06/2019   CO2 29 06/06/2019   No results found for: ALT, AST, GGT, ALKPHOS, BILITOT No results found for: CHOL, HDL, LDLCALC, LDLDIRECT, TRIG, CHOLHDL  No results found for: HGBA1C  Assessment & Plan    1. CAD: S/p CABG x 1 in 2016. Stress test in 2019 was normal. Stable with no anginal symptoms. No indication for ischemic evaluation.  Continue aspirin, pravastatin.  2. H/o PDA: Noted at time of CABG. Not clear that this was ligated at the time of surgery.   3. Hyperlipidemia: LDL was 74 in 06/2021. Monitored and managed per PCP. Continue  pravastatin.   4. Valvular heart disease: Echo in February 2021 showed EF 65 to 70%, mild LVH, G1 DD, mild mitral valve regurgitation, mild aortic valve regurgitation. Euvolemic and well compensated on exam.  He denies any shortness of breath he does report increased daytime sleepiness.  Discussed possibility of repeat echocardiogram, patient declines at this time. I advised him that should his symptoms progress, recommend repeat echocardiogram.  5. OSA: Adherent to CPAP. Follows with pulmonology.  Given increase in daytime sleepiness as above, recommend follow-up with pulmonology.  6. CKD stage IIIa: No recent creatinine on file.  Creatinine was 1.6 in July 2021.  Monitored and managed per PCP.  7. Disposition: Follow-up in 1 year or sooner if needed.   Lenna Sciara, NP 04/22/2022, 8:20 AM

## 2022-07-10 ENCOUNTER — Other Ambulatory Visit: Payer: Self-pay

## 2022-07-10 ENCOUNTER — Emergency Department (HOSPITAL_BASED_OUTPATIENT_CLINIC_OR_DEPARTMENT_OTHER)
Admission: EM | Admit: 2022-07-10 | Discharge: 2022-07-11 | Disposition: A | Discharge status: Home / Self Care | Payer: Medicare Other | Attending: Emergency Medicine | Admitting: Emergency Medicine

## 2022-07-10 ENCOUNTER — Emergency Department (HOSPITAL_BASED_OUTPATIENT_CLINIC_OR_DEPARTMENT_OTHER): Payer: Medicare Other

## 2022-07-10 ENCOUNTER — Encounter (HOSPITAL_BASED_OUTPATIENT_CLINIC_OR_DEPARTMENT_OTHER): Payer: Self-pay | Admitting: Emergency Medicine

## 2022-07-10 DIAGNOSIS — R011 Cardiac murmur, unspecified: Secondary | ICD-10-CM | POA: Insufficient documentation

## 2022-07-10 DIAGNOSIS — I519 Heart disease, unspecified: Secondary | ICD-10-CM | POA: Diagnosis not present

## 2022-07-10 DIAGNOSIS — I2510 Atherosclerotic heart disease of native coronary artery without angina pectoris: Secondary | ICD-10-CM | POA: Diagnosis not present

## 2022-07-10 DIAGNOSIS — Z7982 Long term (current) use of aspirin: Secondary | ICD-10-CM | POA: Insufficient documentation

## 2022-07-10 DIAGNOSIS — N189 Chronic kidney disease, unspecified: Secondary | ICD-10-CM | POA: Diagnosis not present

## 2022-07-10 DIAGNOSIS — I493 Ventricular premature depolarization: Secondary | ICD-10-CM | POA: Diagnosis not present

## 2022-07-10 DIAGNOSIS — R55 Syncope and collapse: Secondary | ICD-10-CM | POA: Diagnosis present

## 2022-07-10 LAB — BASIC METABOLIC PANEL
Anion gap: 10 (ref 5–15)
BUN: 20 mg/dL (ref 8–23)
CO2: 29 mmol/L (ref 22–32)
Calcium: 9.9 mg/dL (ref 8.9–10.3)
Chloride: 103 mmol/L (ref 98–111)
Creatinine, Ser: 1.62 mg/dL — ABNORMAL HIGH (ref 0.61–1.24)
GFR, Estimated: 42 mL/min — ABNORMAL LOW (ref >60)
Glucose, Bld: 95 mg/dL (ref 70–99)
Potassium: 3.9 mmol/L (ref 3.5–5.1)
Sodium: 142 mmol/L (ref 135–145)

## 2022-07-10 LAB — TROPONIN I (HIGH SENSITIVITY)
Troponin I (High Sensitivity): 8 ng/L (ref <18)
Troponin I (High Sensitivity): 9 ng/L (ref <18)

## 2022-07-10 LAB — CBC
HCT: 46.1 % (ref 39.0–52.0)
Hemoglobin: 15.2 g/dL (ref 13.0–17.0)
MCH: 30 pg (ref 26.0–34.0)
MCHC: 33 g/dL (ref 30.0–36.0)
MCV: 91.1 fL (ref 80.0–100.0)
Platelets: 187 10*3/uL (ref 150–400)
RBC: 5.06 MIL/uL (ref 4.22–5.81)
RDW: 13 % (ref 11.5–15.5)
WBC: 6.5 10*3/uL (ref 4.0–10.5)
nRBC: 0 % (ref 0.0–0.2)

## 2022-07-10 LAB — MAGNESIUM: Magnesium: 2.4 mg/dL (ref 1.7–2.4)

## 2022-07-10 NOTE — ED Provider Notes (Signed)
[facility] EMERGENCY DEPT Provider Note   CSN: [id number] Arrival date & time: 07/10/22  1831     History  Chief Complaint  Patient presents with   Near Syncope    Cole Walker is a 81 y.o. male.  81 year old male brought in by daughter and son-in-law.  Patient told family tonight that he has been having episodes where he feels like his blood rushes to his head and he is a little lightheaded when this happens.  Patient is not flushed in appearance when he has these episodes.  They can occur while standing or sitting.  Family checked his blood pressure today, was found to be 185/83 with a manual pulse in the 40s.  Past medical history significant for heart disease, hyperlipidemia, CKD, CAD, DVT, PE.  Patient is not anticoagulated, is not on a beta-blocker.  No recent medication changes.  He denies chest pain or shortness of breath or lower extremity edema.       Home Medications Prior to Admission medications   Medication Sig Start Date End Date Taking? Authorizing Provider  acetaminophen (TYLENOL) 650 MG CR tablet Take 650 mg by mouth every 8 (eight) hours as needed for pain.    [provider]  aspirin 81 MG chewable tablet Chew 81 mg by mouth daily.    [provider]  Biotin 5000 MCG CAPS Take 5,000 mcg by mouth daily.     [provider]  calcium carbonate (TUMS - DOSED IN MG ELEMENTAL CALCIUM) 500 MG chewable tablet Chew 500 mg by mouth daily.     [provider]  Cholecalciferol (VITAMIN D) 50 MCG (2000 UT) tablet Take 2,000 Units by mouth daily.     [provider]  Cod Liver Oil 1000 MG CAPS Take 1,000 mg by mouth daily.     [provider]  cycloSPORINE (RESTASIS) 0.05 % ophthalmic emulsion Place 1 drop into both eyes 2 (two) times daily.     [provider]  finasteride (PROSCAR) 5 MG tablet Take 5 mg by mouth daily.    [provider]  folic acid (FOLVITE) 400 MCG tablet Take 400 mcg by  mouth daily.    [provider]  Multiple Vitamin (MULTIVITAMIN WITH MINERALS) TABS tablet Take 1 tablet by mouth daily.    [provider]  polyethylene glycol (MIRALAX / GLYCOLAX) 17 g packet Take 17 g by mouth daily.    [provider]  pravastatin (PRAVACHOL) 40 MG tablet Take 40 mg by mouth daily.    [provider]  vitamin B-12 (CYANOCOBALAMIN) 250 MCG tablet Take 250 mcg by mouth daily.     [provider]      Allergies    Sulfa antibiotics    Review of Systems   Review of Systems Negative except as per HPI Physical Exam Updated Vital Signs BP 139/82   Pulse (!) 55   Temp 98.5 F (36.9 C)   Resp 18   SpO2 98%  Physical Exam Vitals and nursing note reviewed.  Constitutional:      General: He is not in acute distress.    Appearance: He is well-developed. He is not diaphoretic.  HENT:     Head: Normocephalic and atraumatic.  Cardiovascular:     Rate and Rhythm: Normal rate. Rhythm irregular.     Pulses: Normal pulses.     Heart sounds: Murmur heard.  Pulmonary:     Effort: Pulmonary effort is normal.     Breath  sounds: Normal breath sounds.  Abdominal:     Palpations: Abdomen is soft.     Tenderness: There is no abdominal tenderness.  Musculoskeletal:     Right lower leg: No edema.     Left lower leg: No edema.     Comments: Minimal lower extremity edema  Skin:    General: Skin is warm and dry.     Findings: No erythema or rash.  Neurological:     Mental Status: He is alert and oriented to person, place, and time.  Psychiatric:        Behavior: Behavior normal.     ED Results / Procedures / Treatments   Labs (all labs ordered are listed, but only abnormal results are displayed) Labs Reviewed  BASIC METABOLIC PANEL - Abnormal; Notable for the following components:      Result Value   Creatinine, Ser 1.62 (*)    GFR, Estimated 42 (*)    All other components within normal limits  CBC  MAGNESIUM  TROPONIN I  (HIGH SENSITIVITY)  TROPONIN I (HIGH SENSITIVITY)    EKG EKG Interpretation  Date/Time:  Sunday July 10 2022 19:00:31 EDT Ventricular Rate:  62 PR Interval:  282 QRS Duration: 98 QT Interval:  406 QTC Calculation: 413 R Axis:   42 Text Interpretation: Sinus rhythm Ventricular trigeminy Prolonged PR interval Anterior infarct, old Nonspecific T abnormalities, lateral leads Confirmed by Campbell Stall ([id number]) on [id number] 7:20:29 PM  Radiology DG Chest Port 1 View  Result Date: 07/10/2022 CLINICAL DATA:  Chest pain EXAM: PORTABLE CHEST 1 VIEW COMPARISON:  None Available. FINDINGS: Heart and mediastinal contours are within normal limits. No focal opacities or effusions. No acute bony abnormality. IMPRESSION: No active disease. Electronically Signed   By: Rolm Baptise M.D.   On: 07/10/2022 20:22    Procedures Procedures    Medications Ordered in ED Medications - No data to display  ED Course/ Medical Decision Making/ A&P                           Medical Decision Making  This patient presents to the ED for concern of sensation of blood rushing to head and feeling lightheaded, found to be hypertensive and bradycardic per family, this involves an extensive number of treatment options, and is a complaint that carries with it a high risk of complications and morbidity.  The differential diagnosis includes but not limited to arrhythmia, metabolic abnormality   Co morbidities that complicate the patient evaluation  For his degree AV block, hyperlipidemia, CAD, PE, DVT, CKD   Additional history obtained:  Additional history obtained from daughter and son-in-law at bedside who contribute to history as above External records from outside source obtained and reviewed including prior EKGs on file dating back to 2020 showing first-degree AV block Prior echo dated 01/09/20 with normal LVEF, mild valvular disease, grade 1 dd, mild LAE   Lab Tests:  I Ordered, and personally interpreted labs.   The pertinent results include: CBC unremarkable, BMP with slight uptrend in creatinine, previously 1.4, currently 1.62 with GFR of 42.  Magnesium is normal, initial troponin is 8, will hold for delta.   Imaging Studies ordered:  I ordered imaging studies including chest x-ray I independently visualized and interpreted imaging which showed no acute abnormality I agree with the radiologist interpretation   Cardiac Monitoring: / EKG:  The patient was maintained on a cardiac monitor.  I personally viewed and interpreted the cardiac  monitored which showed an underlying rhythm of: Bradycardia with frequent PVCs.  Further observation to monitor, patient will have PVCs every 3-11 QRS   Consultations Obtained:  I requested consultation with the ER attending, Dr. Wallace Cullens ER,  and discussed lab and imaging findings as well as pertinent plan - they recommend: Cardiac work-up and if reassuring can discharge to follow-up with cardiology   Problem List / ED Course / Critical interventions / Medication management  81 year old male with concern for feeling like blood rushes to head and feels light headed at times. Had an episode today while with family (daughter and son-in-law with whom he lives, both are pharmacists) who checked his vitals and found patient to be hypertensive and bradycardic with irregular rate. Patient arrives in the ER with improved bp and HR in the 60s, found to have frequent PVCs on EKG and monitor. Discussed with Dr. Wallace Cullens, ER attending, will complete cardiac work up as patient is feeling light headed with his episodes, if reassuring, can likely follow up with his cardiologist (is scheduled for holter monitor in the coming week).  I have reviewed the patients home medicines and have made adjustments as needed   Social Determinants of Health:  Lives with family, has cardiologist   Test / Admission - Considered:  Pending at time of sign out.         Final Clinical  Impression(s) / ED Diagnoses Final diagnoses:  Frequent PVCs    Rx / DC Orders ED Discharge Orders     None         Jeannie Fend, PA-C 07/10/22 2200    Edwin Dada P, DO 07/11/22 0025

## 2022-07-10 NOTE — ED Triage Notes (Signed)
Pt has been having feelings of head pressure /quite not feeling right for about a month. Primary doctor is going to put him on holter monitor on aug 15.today pt felt pressure in head, bp was 185/83, pulse 40.in triage pulse is 30-60, pb 133/80

## 2022-07-10 NOTE — ED Provider Notes (Signed)
Delta trop stable.   Patient in no distress and overall condition improved here in the ED. Detailed discussions were had with the patient regarding current findings, and need for close f/u with PCP or on call doctor. The patient has been instructed to return immediately if the symptoms worsen in any way for re-evaluation. Patient verbalized understanding and is in agreement with current care plan. All questions answered prior to discharge.    Edwin Dada P, DO 07/10/22 2327

## 2022-07-10 NOTE — Discharge Instructions (Signed)
Follow up with your cardiologist regarding ER visit with frequent PVCs. Return to the ER for worsening or concerning symptoms.  Recheck with your PCP regarding slight increase in your Cr from prior on file (kidney function).

## 2022-07-11 ENCOUNTER — Ambulatory Visit (INDEPENDENT_AMBULATORY_CARE_PROVIDER_SITE_OTHER): Payer: Medicare Other | Admitting: Family

## 2022-07-11 ENCOUNTER — Telehealth: Payer: Self-pay | Admitting: Internal Medicine

## 2022-07-11 ENCOUNTER — Encounter (HOSPITAL_BASED_OUTPATIENT_CLINIC_OR_DEPARTMENT_OTHER): Payer: Self-pay | Admitting: Family

## 2022-07-11 ENCOUNTER — Ambulatory Visit (INDEPENDENT_AMBULATORY_CARE_PROVIDER_SITE_OTHER): Payer: Medicare Other

## 2022-07-11 VITALS — BP 128/84 | HR 63 | Ht 68.0 in | Wt 181.0 lb

## 2022-07-11 DIAGNOSIS — I25118 Atherosclerotic heart disease of native coronary artery with other forms of angina pectoris: Secondary | ICD-10-CM

## 2022-07-11 DIAGNOSIS — I351 Nonrheumatic aortic (valve) insufficiency: Secondary | ICD-10-CM

## 2022-07-11 DIAGNOSIS — I340 Nonrheumatic mitral (valve) insufficiency: Secondary | ICD-10-CM

## 2022-07-11 DIAGNOSIS — E785 Hyperlipidemia, unspecified: Secondary | ICD-10-CM | POA: Diagnosis not present

## 2022-07-11 DIAGNOSIS — I493 Ventricular premature depolarization: Secondary | ICD-10-CM

## 2022-07-11 DIAGNOSIS — N1831 Chronic kidney disease, stage 3a: Secondary | ICD-10-CM

## 2022-07-11 DIAGNOSIS — G4733 Obstructive sleep apnea (adult) (pediatric): Secondary | ICD-10-CM

## 2022-07-11 MED ORDER — METOPROLOL SUCCINATE ER 25 MG PO TB24: 25.0000 mg | ORAL_TABLET | Freq: Every day | ORAL | 0 refills | Status: DC

## 2022-07-11 MED ORDER — METOPROLOL SUCCINATE ER 25 MG PO TB24: 25.0000 mg | ORAL_TABLET | Freq: Every day | ORAL | 3 refills | Status: AC

## 2022-07-11 NOTE — Patient Instructions (Addendum)
Medication Instructions:  Your physician has recommended you make the following change in your medication:   Start: Metoprolol Succinate (Toprol) 25mg  daily   *If you need a refill on your cardiac medications before your next appointment, please call your pharmacy*   Lab Work: Your physician recommends that you return for lab work on the day of your Echocardiogram, bring your lab slips with you!   If you have labs (blood work) drawn today and your tests are completely normal, you will receive your results only by: MyChart Message (if you have MyChart) OR A paper copy in the mail If you have any lab test that is abnormal or we need to change your treatment, we will call you to review the results.   Testing/Procedures: Your physician has requested that you have an echocardiogram. Echocardiography is a painless test that uses sound waves to create images of your heart. It provides your doctor with information about the size and shape of your heart and how well your heart's chambers and valves are working. This procedure takes approximately one hour. There are no restrictions for this procedure. [address]  Your physician has recommended that you wear a Zio monitor.   This monitor is a medical device that records the heart's electrical activity. Doctors most often use these monitors to diagnose arrhythmias. Arrhythmias are problems with the speed or rhythm of the heartbeat. The monitor is a small device applied to your chest. You can wear one while you do your normal daily activities. While wearing this monitor if you have any symptoms to push the button and record what you felt. Once you have worn this monitor for the period of time provider prescribed (Usually 14 days), you will return the monitor device in the postage paid box. Once it is returned they will download the data collected and provide [id number] with a report which the provider will then review and we will call you  with those results. Important tips:  Avoid showering during the first 24 hours of wearing the monitor. Avoid excessive sweating to help maximize wear time. Do not submerge the device, no hot tubs, and no swimming pools. Keep any lotions or oils away from the patch. After 24 hours you may shower with the patch on. Take brief showers with your back facing the shower head.  Do not remove patch once it has been placed because that will interrupt data and decrease adhesive wear time. Push the button when you have any symptoms and write down what you were feeling. Once you have completed wearing your monitor, remove and place into box which has postage paid and place in your outgoing mailbox.  If for some reason you have misplaced your box then call our office and we can provide another box and/or mail it off for you.     Follow-Up: At [facility], you and your health needs are our priority.  As part of our continuing mission to provide you with exceptional heart care, we have created designated Provider Care Teams.  These Care Teams include your primary Cardiologist (physician) and Advanced Practice Providers (APPs -  Physician Assistants and Nurse Practitioners) who all work together to provide you with the care you need, when you need it.  We recommend signing up for the patient portal called "MyChart".  Sign up information is provided on this After Visit Summary.  MyChart is used to connect with patients for Virtual Visits (Telemedicine).  Patients are able to view lab/test results,  encounter notes, upcoming appointments, etc.  Non-urgent messages can be sent to your provider as well.   To learn more about what you can do with MyChart, go to [email or web address].    Your next appointment:   6 week(s)  The format for your next appointment:   In Person  Provider:   Chrystie Nose, MD  or Gillian Shields, NP {  Other Instructions Heart Healthy Diet Recommendations: A low-salt diet is  recommended. Meats should be grilled, baked, or boiled. Avoid fried foods. Focus on lean protein sources like fish or chicken with vegetables and fruits. The American Heart Association is a Chief Technology Officer!  American Heart Association Diet and Lifeystyle Recommendations   Exercise recommendations: The American Heart Association recommends 150 minutes of moderate intensity exercise weekly. Try 30 minutes of moderate intensity exercise 4-5 times per week. This could include walking, jogging, or swimming.   Important Information About Sugar     '

## 2022-07-11 NOTE — Telephone Encounter (Signed)
STAT if HR is under 50 or over 120 (normal HR is 60-100 beats per minute)  What is your heart rate?   Do you have a log of your heart rate readings (document readings)?  Ranging 40-60, HR has gotten as low as 25 yesterday   Do you have any other symptoms?  No

## 2022-07-11 NOTE — Telephone Encounter (Signed)
Patient stated he went to ED yesterday for lightheadedness and slow heart rate. EKG showed PVCs. Asymptomatic while on phone. Appointment with Brunetta Genera for today.

## 2022-07-11 NOTE — Telephone Encounter (Signed)
Thanks for the update. Will address at clinic visit.   Alver Sorrow, NP

## 2022-07-11 NOTE — Progress Notes (Signed)
Office Visit    Patient Name: Cole Walker Date of Encounter: 07/11/2022  PCP:  Adrian Prince, MD   [facility]  Cardiologist:  Chrystie Nose, MD  Advanced Practice Provider:  No care team member to display Electrophysiologist:  None      Chief Complaint    Cole Walker is a 81 y.o. male presents today for ED follow-up  Past Medical History    Past Medical History:  Diagnosis Date   Chronic kidney disease    Coronary artery disease    Deep vein blood clot of left lower extremity (HCC) 2017   Heart disease    Hyperlipidemia    Pulmonary emboli (HCC) 2017   Sleep apnea    Past Surgical History:  Procedure Laterality Date   CORONARY ARTERY BYPASS GRAFT  2017   PROSTATE SURGERY  2017   REVERSE SHOULDER ARTHROPLASTY Left 06/13/2019   Procedure: REVERSE SHOULDER ARTHROPLASTY;  Surgeon: Francena Hanly, MD;  Location: [facility];  Service: Orthopedics;  Laterality: Left;    TOTAL SHOULDER REPLACEMENT  2018    Allergies  Allergies  Allergen Reactions   Sulfa Antibiotics Nausea Only    GI Upset     History of Present Illness    Cole Walker is a 81 y.o. male with a hx of CAD s/p CABG X1, PE/DVT, hyperlipidemia, OSA on CPAP, mild AI/MR, CKD last seen 04/22/2022.  In 2016 presented to outside hospital in [place] with shortness of breath with cardiac catheterization revealing 99% proximal LAD lesion and underwent off-pump CABG X1 with LIMA-LAD.  Noted patent ductus arteriosus at surgery but not clear if it was ligated.  Stress test June 2019 negative for ischemia.  Prior PE/DVT which occurred after shoulder surgery and was anticoagulated on warfarin for 6 months.  Echo 01/2020 EF 65 to 70%, mild LVH, grade 1 diastolic dysfunction, mild MR/AI.  Last seen in clinic 04/22/2022.  Noted increasing daytime sleepiness despite CPAP adherence.  Otherwise doing well without complaint.  He was offered repeat echocardiogram but politely declined.  Encouraged to  follow-up with pulmonology regards to his CPAP and sleepiness.  ED visit 07/10/2022 after presenting with lightheadedness.  Vital signs at home with BP 185/83 and manual pulse in the 40s.  EKG revealed ventricular trigeminy, PR 282.  First-degree AV block noted back to 2020.  CBC unremarkable, BMP slight uptrending creatinine (1.4 ?1 .62) with GFR 42, magnesium normal, troponin x2 unremarkable Monitor revealed bradycardia with frequent PVC.   He presents today for follow-up with his son in law Dwayne. For one month has had feeling of lightheadedness associated with flushed feeling. Does note skipped beats when he was younger and very tired felt like a "break" in the heart rate. This feels different and is more frequently. Goes to the gym every morning at 5 AM playing pickeball or using exercise machines without dyspnea nor chest pain. Drinks glass of water in the morning then 3 cups of coffee but endorses not much hydration later in the day.   Reports no shortness of breath nor dyspnea on exertion. Reports no chest pain, pressure, or tightness. No edema, orthopnea, PND.   EKGs/Labs/Other Studies Reviewed:   The following studies were reviewed today:  Echo 01/09/20  1. Left ventricular ejection fraction, by visual estimation, is 65 to  70%. The left ventricle has hyperdynamic function. There is mildly  increased left ventricular hypertrophy.   2. Elevated left atrial pressure.   3. Left ventricular diastolic parameters  are consistent with Grade I  diastolic dysfunction (impaired relaxation).   4. The left ventricle has no regional wall motion abnormalities.   5. Global right ventricle has normal systolic function.The right  ventricular size is normal. No increase in right ventricular wall  thickness.   6. Left atrial size was mildly dilated.   7. Right atrial size was normal.   8. The mitral valve is normal in structure. Mild mitral valve  regurgitation. No evidence of mitral stenosis.   9. The  tricuspid valve is normal in structure.  10. The tricuspid valve is normal in structure. Tricuspid valve  regurgitation is mild.  11. The aortic valve is normal in structure. Aortic valve regurgitation is  mild. No evidence of aortic valve sclerosis or stenosis.  12. The pulmonic valve was normal in structure. Pulmonic valve  regurgitation is not visualized.  13. Normal pulmonary artery systolic pressure.  14. The tricuspid regurgitant velocity is 2.20 m/s, and with an assumed  right atrial pressure of 3 mmHg, the estimated right ventricular systolic  pressure is normal at 22.4 mmHg.  15. The inferior vena cava is normal in size with greater than 50%  respiratory variability, suggesting right atrial pressure of 3 mmHg.   EKG:  EKG is ordered today.  The ekg ordered today demonstrates NSR 63 bpm with PVC in pattern of trigeminy and 1st degree AV block PR 3110ms. No acute St/T wave changes.  Recent Labs: 07/10/2022: BUN 20; Creatinine, Ser 1.62; Hemoglobin 15.2; Magnesium 2.4; Platelets 187; Potassium 3.9; Sodium 142  Recent Lipid Panel No results found for: "CHOL", "TRIG", "HDL", "CHOLHDL", "VLDL", "LDLCALC", "LDLDIRECT"   Home Medications   Current Meds  Medication Sig   acetaminophen (TYLENOL) 650 MG CR tablet Take 650 mg by mouth every 8 (eight) hours as needed for pain.   aspirin 81 MG chewable tablet Chew 81 mg by mouth daily.   Biotin 5000 MCG CAPS Take 5,000 mcg by mouth daily.    calcium carbonate (TUMS - DOSED IN MG ELEMENTAL CALCIUM) 500 MG chewable tablet Chew 500 mg by mouth daily.    Cholecalciferol (VITAMIN D) 50 MCG (2000 UT) tablet Take 2,000 Units by mouth daily.    Cod Liver Oil 1000 MG CAPS Take 1,000 mg by mouth daily.    cycloSPORINE (RESTASIS) 0.05 % ophthalmic emulsion Place 1 drop into both eyes 2 (two) times daily.    finasteride (PROSCAR) 5 MG tablet Take 5 mg by mouth daily.   folic acid (FOLVITE) A999333 MCG tablet Take 400 mcg by mouth daily.   metoprolol  succinate (TOPROL XL) 25 MG 24 hr tablet Take 1 tablet (25 mg total) by mouth daily.   metoprolol succinate (TOPROL-XL) 25 MG 24 hr tablet Take 1 tablet (25 mg total) by mouth daily. Take with or immediately following a meal.   Multiple Vitamin (MULTIVITAMIN WITH MINERALS) TABS tablet Take 1 tablet by mouth daily.   polyethylene glycol (MIRALAX / GLYCOLAX) 17 g packet Take 17 g by mouth daily.   pravastatin (PRAVACHOL) 40 MG tablet Take 40 mg by mouth daily.   vitamin B-12 (CYANOCOBALAMIN) 250 MCG tablet Take 250 mcg by mouth daily.      Review of Systems     All other systems reviewed and are otherwise negative except as noted above.  Physical Exam    VS:  BP 128/84   Pulse 63   Ht 5\' 8"  (1.727 m)   Wt 181 lb (82.1 kg)   BMI 27.52 kg/m  ,  BMI Body mass index is 27.52 kg/m.  Wt Readings from Last 3 Encounters:  07/11/22 181 lb (82.1 kg)  04/22/22 180 lb 9.6 oz (81.9 kg)  10/05/21 178 lb (80.7 kg)    GEN: Well nourished, well developed, in no acute distress. HEENT: normal. Neck: Supple, no JVD, carotid bruits, or masses. Cardiac: RRR, no murmurs, rubs, or gallops. No clubbing, cyanosis, edema.  Radials/PT 2+ and equal bilaterally.  Respiratory:  Respirations regular and unlabored, clear to auscultation bilaterally. GI: Soft, nontender, nondistended. MS: No deformity or atrophy. Skin: Warm and dry, no rash. Neuro:  Strength and sensation are intact. Psych: Normal affect.  Assessment & Plan    PVC / Lightheadedness - ED visit yesterday CBC, magnesium normal. BMP with slight increase in creatinine (1.4 ? 1.6). EKG today PVC in pattern of trigeminy. Lightheadedness concerning for orthostatic hypotension. Education provided on orthostatic precautions: increase hydration, eat regular meals, wear compression socks, with position changes slowly. 14 day ZIO placed in clinic. Start Toprol 25mg  QD for PVC suppression. Known 1st degree AV block since 2020, anticipate low dose beta blocker  will be fine but if significant PVC still by monitor consider referral to EP for AAD. Update echo, thyroid panel.   CAD s/p CABG X1 2016 -stress test 2019 normal.  GDMT includes aspirin, pravastatin. Stable with no anginal symptoms. No indication for ischemic evaluation.  GDMT aspirin, pravastatin.  Add metoprolol today, as above. Heart healthy diet and regular cardiovascular exercise encouraged.    History of PDA -noted at time of CABG.  Unclear if it was ligated at time of surgery.  HLD, LDL goal less than 70 - 06/2022 LDL 67.  Continue Prozac 40 mg daily.  Denies myalgias.  Mild AI/MR -  Noted by echo 01/2020.  Due to recent lightheadedness, update echo to reassess valvular function.  OSA - CPAP compliance encouraged. Follows with pulmonology.   CKD IIIa - Careful titration of diuretic and antihypertensive.           Disposition: Follow up in 6 week(s) with 02/2020, MD or APP.  Signed, Chrystie Nose, NP 07/11/2022, 1:21 PM [facility]

## 2022-07-26 ENCOUNTER — Encounter (HOSPITAL_BASED_OUTPATIENT_CLINIC_OR_DEPARTMENT_OTHER): Payer: Self-pay

## 2022-07-27 ENCOUNTER — Ambulatory Visit (INDEPENDENT_AMBULATORY_CARE_PROVIDER_SITE_OTHER): Payer: Medicare Other

## 2022-07-27 DIAGNOSIS — I25118 Atherosclerotic heart disease of native coronary artery with other forms of angina pectoris: Secondary | ICD-10-CM | POA: Diagnosis not present

## 2022-07-27 DIAGNOSIS — I493 Ventricular premature depolarization: Secondary | ICD-10-CM | POA: Diagnosis not present

## 2022-07-27 DIAGNOSIS — I351 Nonrheumatic aortic (valve) insufficiency: Secondary | ICD-10-CM | POA: Diagnosis not present

## 2022-07-27 DIAGNOSIS — I340 Nonrheumatic mitral (valve) insufficiency: Secondary | ICD-10-CM | POA: Diagnosis not present

## 2022-07-27 LAB — ECHOCARDIOGRAM COMPLETE
Area-P 1/2: 4.21 cm2
P 1/2 time: 1454 msec
Radius: 0.6 cm
S' Lateral: 2.8 cm

## 2022-07-28 ENCOUNTER — Telehealth (HOSPITAL_BASED_OUTPATIENT_CLINIC_OR_DEPARTMENT_OTHER): Payer: Self-pay

## 2022-07-28 DIAGNOSIS — I351 Nonrheumatic aortic (valve) insufficiency: Secondary | ICD-10-CM

## 2022-07-28 NOTE — Telephone Encounter (Signed)
FYI, echo ordered but not lasix

## 2022-07-28 NOTE — Telephone Encounter (Addendum)
Seen by patient Cole Walker on 07/27/2022  4:01 PM; follow up mychart message sent to patient- will order Rx/labs upon patient reply; repeat echo ordered.    ----- Message from Alver Sorrow, NP sent at 07/27/2022  3:39 PM EDT ----- Echocardiogram normal heart pumping function.  Heart moderately stiff.  RV normal size/function.  Bilateral atria mildly dilated.  Mild to moderate leaking of mitral valve.  Aortic valve sclerosis without stenosis.  Mild dilation ascending aorta 39 mm.  Due to elevated left atrial pressure would recommend adding Lasix 20 mg daily with BNP, BMP in 1 week. If does not want to start at this time, can discuss at upcoming follow up.   Repeat echo in 1 year for monitoring.

## 2022-08-02 ENCOUNTER — Other Ambulatory Visit (HOSPITAL_BASED_OUTPATIENT_CLINIC_OR_DEPARTMENT_OTHER): Payer: Self-pay | Admitting: Family

## 2022-08-02 DIAGNOSIS — I351 Nonrheumatic aortic (valve) insufficiency: Secondary | ICD-10-CM

## 2022-08-02 DIAGNOSIS — I493 Ventricular premature depolarization: Secondary | ICD-10-CM

## 2022-08-02 DIAGNOSIS — I25118 Atherosclerotic heart disease of native coronary artery with other forms of angina pectoris: Secondary | ICD-10-CM

## 2022-08-02 DIAGNOSIS — I340 Nonrheumatic mitral (valve) insufficiency: Secondary | ICD-10-CM

## 2022-08-02 NOTE — Telephone Encounter (Signed)
Pt of Dr. Rennis Golden. Please review for refills. Thank you!

## 2022-08-05 ENCOUNTER — Telehealth (HOSPITAL_BASED_OUTPATIENT_CLINIC_OR_DEPARTMENT_OTHER): Payer: Self-pay

## 2022-08-05 DIAGNOSIS — I493 Ventricular premature depolarization: Secondary | ICD-10-CM

## 2022-08-05 NOTE — Telephone Encounter (Addendum)
Seen by patient Cole Walker on 08/05/2022  1:02 PM; referral placed and update sent to patient mychart.     ----- Message from Alver Sorrow, NP sent at 08/05/2022 12:59 PM EDT ----- Monitor with probably normal sinus rhythm with average heart rate of 53 bpm.  There was a heavy burden of PVC (early beats in the bottom chambers of the heart) at 13.7% frequency.  Recommend referral to EP to discuss possible antiarrhythmic medication.

## 2022-08-10 NOTE — Telephone Encounter (Signed)
Hey just wanted to follow up on this patient EP referall, he said he hasn't heard from anyone in regards to getting scheduled. Thanks!

## 2022-08-11 NOTE — Progress Notes (Signed)
Office Visit    Patient Name: Cole Walker Date of Encounter: 08/12/2022  PCP:  Adrian Prince, MD   [facility]  Cardiologist:  Chrystie Nose, MD  Advanced Practice Provider:  No care team member to display Electrophysiologist:  None      Chief Complaint    Cole Walker is a 81 y.o. male presents today for follow up after echo and ZIO.  Past Medical History    Past Medical History:  Diagnosis Date   Chronic kidney disease    Coronary artery disease    Deep vein blood clot of left lower extremity (HCC) 2017   Heart disease    Hyperlipidemia    Pulmonary emboli (HCC) 2017   Sleep apnea    Past Surgical History:  Procedure Laterality Date   CORONARY ARTERY BYPASS GRAFT  2017   PROSTATE SURGERY  2017   REVERSE SHOULDER ARTHROPLASTY Left 06/13/2019   Procedure: REVERSE SHOULDER ARTHROPLASTY;  Surgeon: Francena Hanly, MD;  Location: [facility];  Service: Orthopedics;  Laterality: Left;    TOTAL SHOULDER REPLACEMENT  2018    Allergies  Allergies  Allergen Reactions   Sulfa Antibiotics Nausea Only    GI Upset     History of Present Illness    Cole Walker is a 81 y.o. male with a hx of CAD s/p CABG X1, PE/DVT, hyperlipidemia, OSA on CPAP, mild AI/MR, CKD last seen 07/11/22.  In 2016 presented to outside hospital in [place] with shortness of breath with cardiac catheterization revealing 99% proximal LAD lesion and underwent off-pump CABG X1 with LIMA-LAD.  Noted patent ductus arteriosus at surgery but not clear if it was ligated.  Stress test June 2019 negative for ischemia.  Prior PE/DVT which occurred after shoulder surgery and was anticoagulated on warfarin for 6 months.  Echo 01/2020 EF 65 to 70%, mild LVH, grade 1 diastolic dysfunction, mild MR/AI.  Seen in clinic 04/22/2022.  Noted increasing daytime sleepiness despite CPAP adherence.  Otherwise doing well without complaint.  He was offered repeat echocardiogram but politely declined.    ED visit  07/10/2022 after presenting with lightheadedness.  Vital signs at home with BP 185/83 and manual pulse in the 40s.  EKG revealed ventricular trigeminy, PR 282.  First-degree AV block noted back to 2020.  CBC unremarkable, BMP slight uptrending creatinine (1.4 ?1 .62) with GFR 42, magnesium normal, troponin x2 unremarkable. Monitor revealed bradycardia with frequent PVC.   Seen in follow up 07/11/22. Toprol 25mg  QD initiated due to PVC. Echo and ZIO ordered. Echo 07/27/22 normal LVEF 60-65%, gr2DD, mild to moderate MR, aortic sclerosis without stenosis, mild dilation ascending aorta 59mm, elevated left atrial pressure. Recommended for lasix 20mg  daily but preferred to wait til clinic to discuss. ZIO predominanlty NSR with average HR 53 bpm with PVC burden 13.7%. Referred to EP with appointment upcoming next month.   He presents today for follow-up with his daughter. Notes lightheadedness with position changes but no near syncope nor syncope. Notes not drinking much, one cup of water in the morning and then mostly hot tea or coffee. No chest pain, pressure, tightness. Exertional dyspnea is unchanged. Able to exercise playing pickleball 3x per week and lifting weights 2x per week but dyspneic when walking dog outdoors. Reviewed ZIO and echo in detail. No edema, orthopnea, PND. No palpitations. Reports his generalized fatigue is unchanged.  EKGs/Labs/Other Studies Reviewed:   The following studies were reviewed today:  Echo 01/09/20  1. Left ventricular  ejection fraction, by visual estimation, is 65 to  70%. The left ventricle has hyperdynamic function. There is mildly  increased left ventricular hypertrophy.   2. Elevated left atrial pressure.   3. Left ventricular diastolic parameters are consistent with Grade I  diastolic dysfunction (impaired relaxation).   4. The left ventricle has no regional wall motion abnormalities.   5. Global right ventricle has normal systolic function.The right  ventricular size  is normal. No increase in right ventricular wall  thickness.   6. Left atrial size was mildly dilated.   7. Right atrial size was normal.   8. The mitral valve is normal in structure. Mild mitral valve  regurgitation. No evidence of mitral stenosis.   9. The tricuspid valve is normal in structure.  10. The tricuspid valve is normal in structure. Tricuspid valve  regurgitation is mild.  11. The aortic valve is normal in structure. Aortic valve regurgitation is  mild. No evidence of aortic valve sclerosis or stenosis.  12. The pulmonic valve was normal in structure. Pulmonic valve  regurgitation is not visualized.  13. Normal pulmonary artery systolic pressure.  14. The tricuspid regurgitant velocity is 2.20 m/s, and with an assumed  right atrial pressure of 3 mmHg, the estimated right ventricular systolic  pressure is normal at 22.4 mmHg.  15. The inferior vena cava is normal in size with greater than 50%  respiratory variability, suggesting right atrial pressure of 3 mmHg.   EKG:  EKG ordered today.  EKG today reveals sinus bradycardia 48 bpm with first-degree AV block PR 298 with no acute ST/T wave changes.  Recent Labs: 07/10/2022: BUN 20; Creatinine, Ser 1.62; Hemoglobin 15.2; Magnesium 2.4; Platelets 187; Potassium 3.9; Sodium 142  Recent Lipid Panel No results found for: "CHOL", "TRIG", "HDL", "CHOLHDL", "VLDL", "LDLCALC", "LDLDIRECT"  Home Medications   Current Meds  Medication Sig   acetaminophen (TYLENOL) 650 MG CR tablet Take 650 mg by mouth every 8 (eight) hours as needed for pain.   aspirin 81 MG chewable tablet Chew 81 mg by mouth daily.   Biotin 5000 MCG CAPS Take 5,000 mcg by mouth daily.    calcium carbonate (TUMS - DOSED IN MG ELEMENTAL CALCIUM) 500 MG chewable tablet Chew 500 mg by mouth daily.    Cholecalciferol (VITAMIN D) 50 MCG (2000 UT) tablet Take 2,000 Units by mouth daily.    Cod Liver Oil 1000 MG CAPS Take 1,000 mg by mouth daily.    cycloSPORINE  (RESTASIS) 0.05 % ophthalmic emulsion Place 1 drop into both eyes 2 (two) times daily.    finasteride (PROSCAR) 5 MG tablet Take 5 mg by mouth daily.   folic acid (FOLVITE) A999333 MCG tablet Take 400 mcg by mouth daily.   metoprolol succinate (TOPROL XL) 25 MG 24 hr tablet Take 1 tablet (25 mg total) by mouth daily.   Multiple Vitamin (MULTIVITAMIN WITH MINERALS) TABS tablet Take 1 tablet by mouth daily.   polyethylene glycol (MIRALAX / GLYCOLAX) 17 g packet Take 17 g by mouth daily.   pravastatin (PRAVACHOL) 40 MG tablet Take 40 mg by mouth daily.   vitamin B-12 (CYANOCOBALAMIN) 250 MCG tablet Take 250 mcg by mouth daily.      Review of Systems     All other systems reviewed and are otherwise negative except as noted above.  Physical Exam    VS:  BP 118/60 (BP Location: Right Arm, Patient Position: Sitting, Cuff Size: Normal)   Pulse (!) 48   Ht 5\' 8"  (  1.727 m)   Wt 181 lb 14.4 oz (82.5 kg)   BMI 27.66 kg/m  , BMI Body mass index is 27.66 kg/m.  Wt Readings from Last 3 Encounters:  08/12/22 181 lb 14.4 oz (82.5 kg)  07/11/22 181 lb (82.1 kg)  04/22/22 180 lb 9.6 oz (81.9 kg)    GEN: Well nourished, well developed, in no acute distress. HEENT: normal. Neck: Supple, no JVD, carotid bruits, or masses. Cardiac: RRR, bradycardic, no murmurs, rubs, or gallops. No clubbing, cyanosis, edema.  Radials/PT 2+ and equal bilaterally.  Respiratory:  Respirations regular and unlabored, clear to auscultation bilaterally. GI: Soft, nontender, nondistended. MS: No deformity or atrophy. Skin: Warm and dry, no rash. Neuro:  Strength and sensation are intact. Psych: Normal affect.  Assessment & Plan    PVC / Lightheadedness - 14 day ZIO PVC burden 13.7% with average heart rate 53 bpm. EKG today SB 48 bpm with no acute ST/T wave changes. Dizziness with position changes c/w orthostatic hypotension but no near syncope, syncope. Has been referred to EP and may require AAD.  Known 1st degree AV block.   Continue Toprol 25 mg daily  Orthostatic hypotension - likely due to poor PO intake with little fluid intake during the day. Reasonable to continue Toprol at present dose. Education provided on orthostatic precautions: Stay well hydrated, eat regular meals, wear compression socks, with position changes slowly.  Orthostatic VS for the past 24 hrs (Last 3 readings):  BP- Lying Pulse- Lying BP- Sitting Pulse- Sitting BP- Standing at 0 minutes Pulse- Standing at 0 minutes BP- Standing at 3 minutes Pulse- Standing at 3 minutes  08/12/22 1004 129/73 (!) 47 112/70 (!) 47 119/68 (!) 48 105/63 (!) 48    CAD s/p CABG X1 2016 -stress test 2019 normal.  GDMT includes aspirin, pravastatin. Stable with no anginal symptoms. No indication for ischemic evaluation.  GDMT aspirin, pravastatin.   Heart healthy diet and regular cardiovascular exercise encouraged.    History of PDA -noted at time of CABG.  Unclear if it was ligated at time of surgery.  HLD, LDL goal less than 70 - 06/2022 LDL 67.  Continue Pravastatin 40 mg daily.  Denies myalgias.  Mild to moderate MR / Dilation ascending aorta 40mm - By echo 07/2022. Continue optimal BP and volume control.   OSA - CPAP compliance encouraged. Follows with pulmonology.   CKD IIIa - Careful titration of diuretic and antihypertensive.           Disposition: Follow up in 3 month(s) with Chrystie Nose, MD or APP.  Signed, Alver Sorrow, NP 08/12/2022, 1:06 PM [facility]

## 2022-08-12 ENCOUNTER — Encounter (HOSPITAL_BASED_OUTPATIENT_CLINIC_OR_DEPARTMENT_OTHER): Payer: Self-pay | Admitting: Family

## 2022-08-12 ENCOUNTER — Ambulatory Visit (INDEPENDENT_AMBULATORY_CARE_PROVIDER_SITE_OTHER): Payer: Medicare Other | Admitting: Family

## 2022-08-12 VITALS — BP 118/60 | HR 48 | Ht 68.0 in | Wt 181.9 lb

## 2022-08-12 DIAGNOSIS — I25118 Atherosclerotic heart disease of native coronary artery with other forms of angina pectoris: Secondary | ICD-10-CM | POA: Diagnosis not present

## 2022-08-12 DIAGNOSIS — R001 Bradycardia, unspecified: Secondary | ICD-10-CM | POA: Diagnosis not present

## 2022-08-12 DIAGNOSIS — I340 Nonrheumatic mitral (valve) insufficiency: Secondary | ICD-10-CM

## 2022-08-12 DIAGNOSIS — E785 Hyperlipidemia, unspecified: Secondary | ICD-10-CM

## 2022-08-12 DIAGNOSIS — I493 Ventricular premature depolarization: Secondary | ICD-10-CM

## 2022-08-12 DIAGNOSIS — N1831 Chronic kidney disease, stage 3a: Secondary | ICD-10-CM

## 2022-08-12 NOTE — Patient Instructions (Signed)
Medication Instructions:  Your Physician recommend you continue on your current medication as directed.    *If you need a refill on your cardiac medications before your next appointment, please call your pharmacy*  Follow-Up: At [facility], you and your health needs are our priority.  As part of our continuing mission to provide you with exceptional heart care, we have created designated Provider Care Teams.  These Care Teams include your primary Cardiologist (physician) and Advanced Practice Providers (APPs -  Physician Assistants and Nurse Practitioners) who all work together to provide you with the care you need, when you need it.  We recommend signing up for the patient portal called "MyChart".  Sign up information is provided on this After Visit Summary.  MyChart is used to connect with patients for Virtual Visits (Telemedicine).  Patients are able to view lab/test results, encounter notes, upcoming appointments, etc.  Non-urgent messages can be sent to your provider as well.   To learn more about what you can do with MyChart, go to [email or web address].    Your next appointment:   Follow up as scheduled with EP     And  3-4 months with Dr. Rennis Golden   Other Instructions Orthostatic Hypotension Blood pressure is a measurement of how strongly, or weakly, your circulating blood is pressing against the walls of your arteries. Orthostatic hypotension is a drop in blood pressure that can happen when you change positions, such as when you go from lying down to standing. Arteries are blood vessels that carry blood from your heart throughout your body. When blood pressure is too low, you may not get enough blood to your brain or to the rest of your organs. Orthostatic hypotension can cause light-headedness, sweating, rapid heartbeat, blurred vision, and fainting. These symptoms require further investigation into the cause. What are the causes? Orthostatic hypotension can be caused by  many things, including: Sudden changes in posture, such as standing up quickly after you have been sitting or lying down. Loss of blood (anemia) or loss of body fluids (dehydration). Heart problems, neurologic problems, or hormone problems. Pregnancy. Aging. The risk for this condition increases as you get older. Severe infection (sepsis). Certain medicines, such as medicines for high blood pressure or medicines that make the body lose excess fluids (diuretics). What are the signs or symptoms? Symptoms of this condition may include: Weakness, light-headedness, or dizziness. Sweating. Blurred vision. Tiredness (fatigue). Rapid heartbeat. Fainting, in severe cases. How is this diagnosed? This condition is diagnosed based on: Your symptoms and medical history. Your blood pressure measurements. Your health care provider will check your blood pressure when you are: Lying down. Sitting. Standing. A blood pressure reading is recorded as two numbers, such as "120 over 80" (or 120/80). The first ("top") number is called the systolic pressure. It is a measure of the pressure in your arteries as your heart beats. The second ("bottom") number is called the diastolic pressure. It is a measure of the pressure in your arteries when your heart relaxes between beats. Blood pressure is measured in a unit called mmHg. Healthy blood pressure for most adults is 120/80 mmHg. Orthostatic hypotension is defined as a 20 mmHg drop in systolic pressure or a 10 mmHg drop in diastolic pressure within 3 minutes of standing. Other information or tests that may be used to diagnose orthostatic hypotension include: Your other vital signs, such as your heart rate and temperature. Blood tests. An electrocardiogram (ECG) or echocardiogram. A Holter monitor. This is a device  you wear that records your heart rhythm continuously, usually for 24-48 hours. Tilt table test. For this test, you will be safely secured to a table  that moves you from a lying position to an upright position. Your heart rhythm and blood pressure will be monitored during the test. How is this treated? This condition may be treated by: Changing your diet. This may involve eating more salt (sodium) or drinking more water. Changing the dosage of certain medicines you are taking that might be lowering your blood pressure. Correcting the underlying reason for the orthostatic hypotension. Wearing compression stockings. Taking medicines to raise your blood pressure. Avoiding actions that trigger symptoms. Follow these instructions at home: Medicines Take over-the-counter and prescription medicines only as told by your health care provider. Follow instructions from your health care provider about changing the dosage of your current medicines, if this applies. Do not stop or adjust any of your medicines on your own. Eating and drinking  Drink enough fluid to keep your urine pale yellow. Eat extra salt only as directed. Do not add extra salt to your diet unless advised by your health care provider. Eat frequent, small meals. Avoid standing up suddenly after eating. General instructions  Get up slowly from lying down or sitting positions. This gives your blood pressure a chance to adjust. Avoid hot showers and excessive heat as directed by your health care provider. Engage in regular physical activity as directed by your health care provider. If you have compression stockings, wear them as told. Keep all follow-up visits. This is important. Contact a health care provider if: You have a fever for more than 2-3 days. You feel more thirsty than usual. You feel dizzy or weak. Get help right away if: You have chest pain. You have a fast or irregular heartbeat. You become sweaty or feel light-headed. You feel short of breath. You faint. You have any symptoms of a stroke. "BE FAST" is an easy way to remember the main warning signs of a  stroke: B - Balance. Signs are dizziness, sudden trouble walking, or loss of balance. E - Eyes. Signs are trouble seeing or a sudden change in vision. F - Face. Signs are sudden weakness or numbness of the face, or the face or eyelid drooping on one side. A - Arms. Signs are weakness or numbness in an arm. This happens suddenly and usually on one side of the body. S - Speech. Signs are sudden trouble speaking, slurred speech, or trouble understanding what people say. T - Time. Time to call emergency services. Write down what time symptoms started. You have other signs of a stroke, such as: A sudden, severe headache with no known cause. Nausea or vomiting. Seizure. These symptoms may represent a serious problem that is an emergency. Do not wait to see if the symptoms will go away. Get medical help right away. Call your local emergency services (911 in the [place] Do not drive yourself to the hospital. Summary Orthostatic hypotension is a sudden drop in blood pressure. It can cause light-headedness, sweating, rapid heartbeat, blurred vision, and fainting. Orthostatic hypotension can be diagnosed by having your blood pressure taken while lying down, sitting, and then standing. Treatment may involve changing your diet, wearing compression stockings, sitting up slowly, adjusting your medicines, or correcting the underlying reason for the orthostatic hypotension. Get help right away if you have chest pain, a fast or irregular heartbeat, or symptoms of a stroke. This information is not intended to replace advice given  to you by your health care provider. Make sure you discuss any questions you have with your health care provider. Document Revised: 02/04/2021 Document Reviewed: 02/04/2021 Elsevier Patient Education  [place].

## 2022-09-01 ENCOUNTER — Encounter: Payer: Self-pay | Admitting: Cardiovascular Disease

## 2022-09-01 ENCOUNTER — Ambulatory Visit: Payer: Medicare Other | Attending: Cardiovascular Disease | Admitting: Cardiovascular Disease

## 2022-09-01 DIAGNOSIS — I493 Ventricular premature depolarization: Secondary | ICD-10-CM

## 2022-09-01 NOTE — Patient Instructions (Signed)
Medication Instructions:  Your physician recommends that you continue on your current medications as directed. Please refer to the Current Medication list given to you today.  *If you need a refill on your cardiac medications before your next appointment, please call your pharmacy*   Follow-Up: At [facility], you and your health needs are our priority.  As part of our continuing mission to provide you with exceptional heart care, we have created designated Provider Care Teams.  These Care Teams include your primary Cardiologist (physician) and Advanced Practice Providers (APPs -  Physician Assistants and Nurse Practitioners) who all work together to provide you with the care you need, when you need it.  Your next appointment:   1 year(s)  The format for your next appointment:   In Person  Provider:   Augustus Mealor, MD  Important Information About Sugar       

## 2022-09-01 NOTE — Progress Notes (Signed)
Cardiology Office Note:    Date:  09/01/2022   ID:  Cole Walker, DOB 09/21/1941, MRN [id number]  PCP:  Reynold Bowen, [place] Providers Cardiologist:  Pixie Casino, MD     Referring MD: Reynold Bowen, MD   No chief complaint on file. Irregular heart rate  History of Present Illness:    Cole Walker is a 81 y.o. male with a hx of coronary artery disease status post bypass grafting (2016), PE, hyperlipidemia, sleep apnea on CPAP, mild atrial and mitral regurgitation, chronic kidney disease referred for management of premature ventricular contractions.  He reports that ever since he was a child he would occasionally feel a pause in his heart rate followed by a forceful beat.  He is not particularly bothered by these.  His primary complaint is dizziness that occurs upon standing, or rising upright after bending over.  Sometimes he feels a little dizzy after exerting himself, such as climbing up a flight of stairs.  He did not notice one evening that his heart rate as detected by a blood pressure device, was about 30 bpm.  He was not particularly symptomatic at this time.  He denies any presyncope, syncope, chest pain, shortness of breath, ankle edema.  Arrhythmia History: PVCs diagnosed   Past Medical History:  Diagnosis Date   Chronic kidney disease    Coronary artery disease    Deep vein blood clot of left lower extremity ([place]) 2017   Heart disease    Hyperlipidemia    Pulmonary emboli ([place]) 2017   Sleep apnea     Past Surgical History:  Procedure Laterality Date   CORONARY ARTERY BYPASS GRAFT  2017   PROSTATE SURGERY  2017   REVERSE SHOULDER ARTHROPLASTY Left 06/13/2019   Procedure: REVERSE SHOULDER ARTHROPLASTY;  Surgeon: Justice Britain, MD;  Location: [facility];  Service: Orthopedics;  Laterality: Left;  142min   TOTAL SHOULDER REPLACEMENT  2018    Current Medications: Current Meds  Medication Sig   acetaminophen (TYLENOL) 650 MG CR tablet Take 650  mg by mouth every 8 (eight) hours as needed for pain.   aspirin 81 MG chewable tablet Chew 81 mg by mouth daily.   Biotin 5000 MCG CAPS Take 5,000 mcg by mouth daily.    calcium carbonate (TUMS - DOSED IN MG ELEMENTAL CALCIUM) 500 MG chewable tablet Chew 500 mg by mouth daily.    Cholecalciferol (VITAMIN D) 50 MCG (2000 UT) tablet Take 2,000 Units by mouth daily.    Cod Liver Oil 1000 MG CAPS Take 1,000 mg by mouth daily.    cycloSPORINE (RESTASIS) 0.05 % ophthalmic emulsion Place 1 drop into both eyes 2 (two) times daily.    escitalopram (LEXAPRO) 10 MG tablet Take 10 mg by mouth daily.   finasteride (PROSCAR) 5 MG tablet Take 5 mg by mouth daily.   folic acid (FOLVITE) A999333 MCG tablet Take 400 mcg by mouth daily.   metoprolol succinate (TOPROL XL) 25 MG 24 hr tablet Take 1 tablet (25 mg total) by mouth daily.   Multiple Vitamin (MULTIVITAMIN WITH MINERALS) TABS tablet Take 1 tablet by mouth daily.   polyethylene glycol (MIRALAX / GLYCOLAX) 17 g packet Take 17 g by mouth daily.   pravastatin (PRAVACHOL) 40 MG tablet Take 40 mg by mouth daily.   vitamin B-12 (CYANOCOBALAMIN) 250 MCG tablet Take 250 mcg by mouth daily.      Allergies:   Sulfa antibiotics   Social History   Socioeconomic History  Marital status: Widowed    Spouse name: Not on file   Number of children: Not on file   Years of education: Not on file   Highest education level: Not on file  Occupational History   Not on file  Tobacco Use   Smoking status: Never   Smokeless tobacco: Never  Vaping Use   Vaping Use: Never used  Substance and Sexual Activity   Alcohol use: Not Currently   Drug use: Never   Sexual activity: Not on file  Other Topics Concern   Not on file  Social History Narrative   Not on file   Social Determinants of Health   Financial Resource Strain: Not on file  Food Insecurity: Not on file  Transportation Needs: Not on file  Physical Activity: Not on file  Stress: Not on file  Social  Connections: Not on file     Family History: The patient's family history includes Cancer in his brother; Diabetes in his mother; Heart attack in his mother; Hypertension in his mother; Stroke in his sister.  ROS:   Please see the history of present illness.     All other systems reviewed and are negative.  EKGs/Labs/Other Studies Reviewed:    The following studies were reviewed today: Echocardiogram 07/27/22  1. Left ventricular ejection fraction, by estimation, is 60 to 65%. Left  ventricular ejection fraction by 3D volume is 60 %. The left ventricle has  normal function. The left ventricle has no regional wall motion  abnormalities. Left ventricular diastolic   parameters are consistent with Grade II diastolic dysfunction  (pseudonormalization). Elevated left atrial pressure.   2. Right ventricular systolic function is normal. The right ventricular  size is normal. There is normal pulmonary artery systolic pressure. The  estimated right ventricular systolic pressure is 39.7 mmHg.   3. Left atrial size was mildly dilated.   4. Right atrial size was mildly dilated.   5. The mitral valve is normal in structure. Mild to moderate mitral valve  regurgitation.   6. The aortic valve is tricuspid. There is mild thickening of the aortic  valve. Aortic valve regurgitation is trivial. Aortic valve sclerosis is  present, with no evidence of aortic valve stenosis.   7. Aortic dilatation noted. There is mild dilatation of the ascending  aorta, measuring 39 mm.   8. The inferior vena cava is normal in size with greater than 50%  respiratory variability, suggesting right atrial pressure of 3 mmHg.   Monitor 07/2022 Monitor shows 1 brief run of NSVT and 3 brief runs of SVT up to 7 beats. More concerning was a heavy burden of PVC's with a 13.7% frequency during the monitoring period. This burden of PVC's could increase risk of PVC-related cardiomyopathy- consider AAD therapy or EP  evaluation.  EKG:  EKG is ordered today.    It shows sinus rhythm with frequent PVCs. PVCs have an outflow tract morphology (+++ II, III, aVF) and a very posterior focus (++V1)   Orders placed or performed in visit on 08/12/22   EKG 12-Lead     Recent Labs: 07/10/2022: BUN 20; Creatinine, Ser 1.62; Hemoglobin 15.2; Magnesium 2.4; Platelets 187; Potassium 3.9; Sodium 142    Risk Assessment/Calculations:      Repeat BP <140/80        Physical Exam:    VS:  BP (!) 159/78   Pulse 61   Ht 5\' 8"  (1.727 m)   Wt 182 lb (82.6 kg)   BMI 27.67 kg/m  Wt Readings from Last 3 Encounters:  09/01/22 182 lb (82.6 kg)  08/12/22 181 lb 14.4 oz (82.5 kg)  07/11/22 181 lb (82.1 kg)     GEN:  Well nourished, well developed in no acute distress CARDIAC: RRR, no murmurs, rubs, gallops RESPIRATORY:  Normal work of breathing MUSCULOSKELETAL: no edema   ASSESSMENT:    1. Frequent unifocal PVCs    PLAN:    In order of problems listed above:  Frequent PVCs: unifocal, posterior outflow tract morphology with late MDI. His EF is normal. In the absence of symptoms or cardiomyopathy, there is not a strong indication to suppress PVCs with ablation or an antiarrhythmic medication, which would carry higher risk than the PVCs themselves. I would recommend annual TTE to assess EF. If EF declines, I would certainly attempt to suppress the PVCs. I recommended magnesium taurate, which carries little risk and is effective for some patients.         Medication Adjustments/Labs and Tests Ordered: Current medicines are reviewed at length with the patient today.  Concerns regarding medicines are outlined above.  No orders of the defined types were placed in this encounter.  No orders of the defined types were placed in this encounter.   Patient Instructions  Medication Instructions:  Your physician recommends that you continue on your current medications as directed. Please refer to the Current  Medication list given to you today.  *If you need a refill on your cardiac medications before your next appointment, please call your pharmacy*  Follow-Up: At [facility], you and your health needs are our priority.  As part of our continuing mission to provide you with exceptional heart care, we have created designated Provider Care Teams.  These Care Teams include your primary Cardiologist (physician) and Advanced Practice Providers (APPs -  Physician Assistants and Nurse Practitioners) who all work together to provide you with the care you need, when you need it.  Your next appointment:   1 year(s)  The format for your next appointment:   In Person  Provider:   Doralee Albino, MD  Important Information About Sugar         Signed, Melida Quitter, MD  09/01/2022 3:26 PM    [place]

## 2022-09-08 ENCOUNTER — Institutional Professional Consult (permissible substitution): Payer: Medicare Other | Admitting: Cardiovascular Disease

## 2022-11-02 ENCOUNTER — Encounter: Payer: Self-pay | Admitting: Pulmonary Disease

## 2022-11-02 ENCOUNTER — Ambulatory Visit (INDEPENDENT_AMBULATORY_CARE_PROVIDER_SITE_OTHER): Payer: Medicare Other | Admitting: Pulmonary Disease

## 2022-11-02 VITALS — BP 112/74 | HR 53 | Temp 97.7°F | Ht 68.0 in | Wt 185.6 lb

## 2022-11-02 DIAGNOSIS — G4733 Obstructive sleep apnea (adult) (pediatric): Secondary | ICD-10-CM | POA: Diagnosis not present

## 2022-11-02 NOTE — Patient Instructions (Signed)
I will see you about a year from now if you are still around otherwise good luck with your move  Continue using your CPAP on a nightly basis  Call us with significant concerns  Any healthcare system using epic should be able to access your records, there is also a system called care everywhere that can access other systems for your health records  If you were to get to a point where the mask becomes a bigger issue, the medical supply company should be able to help

## 2022-11-02 NOTE — Progress Notes (Unsigned)
Cole Walker    [id number]    1941/04/15  Primary Care Physician:South, Jeannett Senior, MD  Referring Physician: Adrian Prince, MD [address] [place],  [place] [address]  Chief complaint:   Patient with a history of obstructive sleep apnea  HPI:  In for follow-up today Has been doing relatively well  No new health issues On auto CPAP 10-15 Machine is working well He is feeling well  He does have some daytime sleepiness but this is overall not limiting  Usually goes to bed between 8 and 9 PM Takes about half an hour to fall asleep about 3 awakenings Final wake up time about 5 AM  Weight has been stable  Initial sleep study was 15 years ago  Denies any other associated complaints at present  Outpatient Encounter Medications as of 11/02/2022  Medication Sig   acetaminophen (TYLENOL) 650 MG CR tablet Take 650 mg by mouth every 8 (eight) hours as needed for pain.   aspirin 81 MG chewable tablet Chew 81 mg by mouth daily.   Biotin 5000 MCG CAPS Take 5,000 mcg by mouth daily.    calcium carbonate (TUMS - DOSED IN MG ELEMENTAL CALCIUM) 500 MG chewable tablet Chew 500 mg by mouth daily.    Cholecalciferol (VITAMIN D) 50 MCG (2000 UT) tablet Take 2,000 Units by mouth daily.    Cod Liver Oil 1000 MG CAPS Take 1,000 mg by mouth daily.    cycloSPORINE (RESTASIS) 0.05 % ophthalmic emulsion Place 1 drop into both eyes 2 (two) times daily.    escitalopram (LEXAPRO) 10 MG tablet Take 10 mg by mouth daily.   finasteride (PROSCAR) 5 MG tablet Take 5 mg by mouth daily.   folic acid (FOLVITE) 400 MCG tablet Take 400 mcg by mouth daily.   metoprolol succinate (TOPROL XL) 25 MG 24 hr tablet Take 1 tablet (25 mg total) by mouth daily.   Multiple Vitamin (MULTIVITAMIN WITH MINERALS) TABS tablet Take 1 tablet by mouth daily.   NON FORMULARY Magnesium Taurate 4500 mg once a day for heart   polyethylene glycol (MIRALAX / GLYCOLAX) 17 g packet Take 17 g by mouth daily.   pravastatin  (PRAVACHOL) 40 MG tablet Take 40 mg by mouth daily.   vitamin B-12 (CYANOCOBALAMIN) 250 MCG tablet Take 250 mcg by mouth daily.    No facility-administered encounter medications on file as of 11/02/2022.    Allergies as of 11/02/2022 - Review Complete 11/02/2022  Allergen Reaction Noted   Sulfa antibiotics Nausea Only 08/11/2006    Past Medical History:  Diagnosis Date   Chronic kidney disease    Coronary artery disease    Deep vein blood clot of left lower extremity ([facility]) 2017   Heart disease    Hyperlipidemia    Pulmonary emboli ([facility]) 2017   Sleep apnea     Past Surgical History:  Procedure Laterality Date   CORONARY ARTERY BYPASS GRAFT  2017   PROSTATE SURGERY  2017   REVERSE SHOULDER ARTHROPLASTY Left 06/13/2019   Procedure: REVERSE SHOULDER ARTHROPLASTY;  Surgeon: Francena Hanly, MD;  Location: [facility];  Service: Orthopedics;  Laterality: Left;    TOTAL SHOULDER REPLACEMENT  2018    Family History  Problem Relation Age of Onset   Heart attack Mother    Hypertension Mother    Diabetes Mother    Stroke Sister    Cancer Brother     Social History   Socioeconomic History   Marital status:  Widowed    Spouse name: Not on file   Number of children: Not on file   Years of education: Not on file   Highest education level: Not on file  Occupational History   Not on file  Tobacco Use   Smoking status: Never   Smokeless tobacco: Never  Vaping Use   Vaping Use: Never used  Substance and Sexual Activity   Alcohol use: Not Currently   Drug use: Never   Sexual activity: Not on file  Other Topics Concern   Not on file  Social History Narrative   Not on file   Social Determinants of Health   Financial Resource Strain: Not on file  Food Insecurity: Not on file  Transportation Needs: Not on file  Physical Activity: Not on file  Stress: Not on file  Social Connections: Not on file  Intimate Partner Violence: Not on file    Review of Systems   Constitutional:  Positive for fatigue.  Respiratory:  Positive for apnea.   Psychiatric/Behavioral:  Positive for sleep disturbance.   All other systems reviewed and are negative.   Vitals:   11/02/22 1305  BP: 112/74  Pulse: (!) 53  Temp: 97.7 F (36.5 C)  SpO2: 100%     Physical Exam Constitutional:      Appearance: Normal appearance.  HENT:     Head: Normocephalic.     Nose: No congestion or rhinorrhea.     Mouth/Throat:     Comments: Mallampati 3, crowded oropharynx Eyes:     General:        Right eye: No discharge.        Left eye: No discharge.  Cardiovascular:     Rate and Rhythm: Normal rate and regular rhythm.     Heart sounds: No murmur heard.    No friction rub.  Pulmonary:     Effort: No respiratory distress.     Breath sounds: No stridor. No wheezing or rhonchi.  Musculoskeletal:     Cervical back: No rigidity or tenderness.  Neurological:     Mental Status: He is alert.  Psychiatric:        Mood and Affect: Mood normal.      10/09/2020   10:21 AM  Results of the Epworth flowsheet  Sitting and reading 3  Watching TV 3  Sitting, inactive in a public place (e.g. a theatre or a meeting) 0  As a passenger in a car for an hour without a break 0  Lying down to rest in the afternoon when circumstances permit 0  Sitting and talking to someone 0  Sitting quietly after a lunch without alcohol 3  In a car, while stopped for a few minutes in traffic 0  Total score 9    Data Reviewed: Compliance reviewed showing 100% compliance Average use of 10 hours 52 minutes Pressure settings of 5-15 AHI of 2.2  Assessment:   Daytime sleepiness is better  Obstructive sleep apnea is better treated with excellent compliance and continues to benefit from CPAP use  No changes to CPAP therapy  Plan/Recommendations:  Continue with nightly CPAP use  Encouraged to call with significant concerns  Will keep yearly follow-ups    Sherrilyn Rist MD [place]  [facility] 11/02/2022, 1:23 PM  CC: Reynold Bowen, MD

## 2022-11-03 ENCOUNTER — Encounter: Payer: Self-pay | Admitting: Pulmonary Disease

## 2022-11-25 ENCOUNTER — Encounter: Payer: Self-pay | Admitting: Internal Medicine

## 2022-11-25 ENCOUNTER — Ambulatory Visit: Payer: Medicare Other | Attending: Internal Medicine | Admitting: Internal Medicine

## 2022-11-25 VITALS — BP 109/73 | HR 54 | Ht 68.0 in | Wt 185.4 lb

## 2022-11-25 DIAGNOSIS — E785 Hyperlipidemia, unspecified: Secondary | ICD-10-CM | POA: Insufficient documentation

## 2022-11-25 DIAGNOSIS — R001 Bradycardia, unspecified: Secondary | ICD-10-CM | POA: Insufficient documentation

## 2022-11-25 DIAGNOSIS — I493 Ventricular premature depolarization: Secondary | ICD-10-CM | POA: Diagnosis not present

## 2022-11-25 DIAGNOSIS — I2510 Atherosclerotic heart disease of native coronary artery without angina pectoris: Secondary | ICD-10-CM | POA: Diagnosis not present

## 2022-11-25 DIAGNOSIS — I25118 Atherosclerotic heart disease of native coronary artery with other forms of angina pectoris: Secondary | ICD-10-CM

## 2022-11-25 NOTE — Patient Instructions (Signed)
Medication Instructions:  Your physician recommends that you continue on your current medications as directed. Please refer to the Current Medication list given to you today.  *If you need a refill on your cardiac medications before your next appointment, please call your pharmacy*  Follow-Up: At [facility], you and your health needs are our priority.  As part of our continuing mission to provide you with exceptional heart care, we have created designated Provider Care Teams.  These Care Teams include your primary Cardiologist (physician) and Advanced Practice Providers (APPs -  Physician Assistants and Nurse Practitioners) who all work together to provide you with the care you need, when you need it.  We recommend signing up for the patient portal called "MyChart".  Sign up information is provided on this After Visit Summary.  MyChart is used to connect with patients for Virtual Visits (Telemedicine).  Patients are able to view lab/test results, encounter notes, upcoming appointments, etc.  Non-urgent messages can be sent to your provider as well.   To learn more about what you can do with MyChart, go to [email or web address].    Your next appointment:   6 month(s)  The format for your next appointment:   In Person  Provider:   Kenneth C Hilty, MD          

## 2022-11-25 NOTE — Progress Notes (Signed)
OFFICE NOTE  Chief Complaint:  Follow-up  Primary Care Physician: Reynold Bowen, MD  HPI:  Cole Walker is a 81 y.o. male with a past medial history significant for coronary artery disease with onset of shortness of breath in 2016.  Cardiac catheterization showed a 99% proximal LAD lesion and ultimately he underwent off pump CABG with LIMA to LAD by Dr. Myrtha Mantis. Through a left thoracotomy approach.  It is also mentioned that he has a patent ductus arteriosus -it is not clear to me that this was ligated at the time of surgery but may not have been due to the decreased exposure.  He has had some shortness of breath subsequent to this.  Apparently his presenting symptoms was shortness of breath, but he claims he never had chest pain.  More recently he underwent repeat stress testing in June 2019 which was negative for ischemia.  He also has a history of PE/DVT which occurred apparently after shoulder surgery.  He was anticoagulated on warfarin for 6 months and then discontinue that.  He recently established with Dr. Forde Dandy.  Labs in January 2020 indicated total cholesterol 145, HDL 50, LDL 63 and triglycerides 162.  02/16/2021  Cole Walker returns today for follow-up.  He continues to do well.  Denies any chest pain or worsening shortness of breath.  He is physically active.  He had lipids last July.  Total cholesterol 136, HDL 57, triglycerides 87 LDL 62.  He has follow-up lab work this summer.  Blood pressures well controlled today 128/76.  He had an echocardiogram last year.  EF is 65 to 70%, there was aortic valve sclerosis with mild AI, mild left atrial enlargement, mild MR and mild TR.  The aortic root was not measured to be dilated.  11/25/2022  Cole Walker is seen today in follow-up.  Recently he has been seen in the number of times by Laurann Montana, NP.  Primarily he was seen in the emergency department after home blood pressure cuff had read his heart rate at 25.  He was having frequent  PVCs.  He had an echo which showed normal LV function and his aortic root was read only 3.9 cm.  Subsequently he wore a monitor which showed a burden of PVCs that was high at 13.7%.  He was referred to cardiac EP for antiarrhythmic therapy options.  He was seen by Dr. Myles Gip and since he was asymptomatic and thought not to have any LV dysfunction, it was recommended not to suppress his PVCs.  He had talked about adding magnesium taurate and subsequently Cole Walker has been on some beta-blocker.  Heart rate now is in the mid 50s.  He says that his PVCs have generally improved somewhat and he says he gets less dizzy with positional changes.  PMHx:  Past Medical History:  Diagnosis Date   Chronic kidney disease    Coronary artery disease    Deep vein blood clot of left lower extremity ([place]) 2017   Heart disease    Hyperlipidemia    Pulmonary emboli ([place]) 2017   Sleep apnea     Past Surgical History:  Procedure Laterality Date   CORONARY ARTERY BYPASS GRAFT  2017   PROSTATE SURGERY  2017   REVERSE SHOULDER ARTHROPLASTY Left 06/13/2019   Procedure: REVERSE SHOULDER ARTHROPLASTY;  Surgeon: Justice Britain, MD;  Location: [facility];  Service: Orthopedics;  Laterality: Left;  119min   TOTAL SHOULDER REPLACEMENT  2018    FAMHx:  Family History  Problem Relation Age of Onset   Heart attack Mother    Hypertension Mother    Diabetes Mother    Stroke Sister    Cancer Brother     SOCHx:   reports that he has never smoked. He has never used smokeless tobacco. He reports that he does not currently use alcohol. He reports that he does not use drugs.  ALLERGIES:  Allergies  Allergen Reactions   Sulfa Antibiotics Nausea Only    GI Upset     ROS: Pertinent items noted in HPI and remainder of comprehensive ROS otherwise negative.  HOME MEDS: Current Outpatient Medications on File Prior to Visit  Medication Sig Dispense Refill   acetaminophen (TYLENOL) 650 MG CR tablet Take 650 mg by mouth every 8  (eight) hours as needed for pain.     aspirin 81 MG chewable tablet Chew 81 mg by mouth daily.     Biotin 5000 MCG CAPS Take 5,000 mcg by mouth daily.      calcium carbonate (TUMS - DOSED IN MG ELEMENTAL CALCIUM) 500 MG chewable tablet Chew 500 mg by mouth daily.      Cholecalciferol (VITAMIN D) 50 MCG (2000 UT) tablet Take 2,000 Units by mouth daily.      Cod Liver Oil 1000 MG CAPS Take 1,000 mg by mouth daily.      cycloSPORINE (RESTASIS) 0.05 % ophthalmic emulsion Place 1 drop into both eyes 2 (two) times daily.      escitalopram (LEXAPRO) 10 MG tablet Take 10 mg by mouth daily.     finasteride (PROSCAR) 5 MG tablet Take 5 mg by mouth daily.     folic acid (FOLVITE) A999333 MCG tablet Take 400 mcg by mouth daily.     metoprolol succinate (TOPROL XL) 25 MG 24 hr tablet Take 1 tablet (25 mg total) by mouth daily. 90 tablet 3   Multiple Vitamin (MULTIVITAMIN WITH MINERALS) TABS tablet Take 1 tablet by mouth daily.     NON FORMULARY Magnesium Taurate 4500 mg once a day for heart     polyethylene glycol (MIRALAX / GLYCOLAX) 17 g packet Take 17 g by mouth daily.     pravastatin (PRAVACHOL) 40 MG tablet Take 40 mg by mouth daily.     vitamin B-12 (CYANOCOBALAMIN) 250 MCG tablet Take 250 mcg by mouth daily.      No current facility-administered medications on file prior to visit.    LABS/IMAGING: No results found for this or any previous visit (from the past 48 hour(s)). No results found.  LIPID PANEL: No results found for: "CHOL", "TRIG", "HDL", "CHOLHDL", "VLDL", "LDLCALC", "LDLDIRECT"   WEIGHTS: Wt Readings from Last 3 Encounters:  11/25/22 185 lb 6.4 oz (84.1 kg)  11/02/22 185 lb 9.6 oz (84.2 kg)  09/01/22 182 lb (82.6 kg)    VITALS: BP 109/73 (BP Location: Left Arm, Patient Position: Sitting, Cuff Size: Normal)   Pulse (!) 54   Ht 5\' 8"  (1.727 m)   Wt 185 lb 6.4 oz (84.1 kg)   SpO2 100%   BMI 28.19 kg/m   EXAM: General appearance: alert and no distress Neck: no carotid bruit,  no JVD and thyroid not enlarged, symmetric, no tenderness/mass/nodules Lungs: clear to auscultation bilaterally Heart: systolic murmur: early systolic 2/6, buzzing at 2nd right intercostal space Abdomen: soft, non-tender; bowel sounds normal; no masses,  no organomegaly Extremities: extremities normal, atraumatic, no cyanosis or edema Pulses: 2+ and symmetric Skin: Skin color, texture, turgor normal. No rashes or lesions Neurologic:  Grossly normal Psych: Pleasant  EKG: Deferred  ASSESSMENT: CAD with 99% LAD stenosis-status post LIMA to LAD (OPCAB)-2016, [place], [place] Jackquline Denmark, MD) ? PDA OSA on CPAP Hyperlipidemia CKD History of dilated aortic root -measured 3.7 cm (01/2020) Aortic sclerosis/mild AI Frequent PVCs-frequency 13.7% (07/2022)  PLAN: 1.   Cole Walker had recent episode of bradycardia and was found to have frequent PVCs.  He was seen by cardiac EP and suppression was not recommended as he was asymptomatic and his LVEF was normal.  It was recommended to follow his echocardiogram annually.  His aortic root seems to be fairly stable.  Will continue with our current therapies.  Overall he says he feels well.  He denies any chest pain.  Blood pressure is well-controlled.  Plan follow-up with me in 6 months or sooner as necessary.  He says he is planning to move to [place] perhaps in the beginning of 2024.  I have recommended [facility] as an option when he goes there.  Chrystie Nose, MD, [facility], FACP  [place]  [facility]  Medical Director of the Advanced Lipid Disorders &  Cardiovascular Risk Reduction Clinic Diplomate of the American Board of Clinical Lipidology Attending Cardiologist  Direct Dial: [phone]  Fax: [phone]  Website:  [email or web address] 11/25/2022, 9:39 AM

## 2023-07-07 ENCOUNTER — Telehealth (HOSPITAL_BASED_OUTPATIENT_CLINIC_OR_DEPARTMENT_OTHER): Payer: Self-pay | Admitting: Family

## 2023-07-07 NOTE — Telephone Encounter (Signed)
Called patient to discuss scheduling the follow up Echocardiogram ordered by Gillian Shields, NP.  Patient states he and his wife have moved to [place] and are seeing a cardiologist there.  Will cancel the order for [facility]

## 2023-07-07 NOTE — Telephone Encounter (Signed)
Agree with cancelling order. Glad they established with new cardiologist after move.   Alver Sorrow, NP

## 2024-11-12 ENCOUNTER — Other Ambulatory Visit (HOSPITAL_COMMUNITY): Payer: Self-pay

## 2024-11-14 ENCOUNTER — Other Ambulatory Visit (HOSPITAL_COMMUNITY): Payer: Self-pay

## 2024-11-15 ENCOUNTER — Other Ambulatory Visit (HOSPITAL_COMMUNITY): Payer: Self-pay

## 2024-11-15 ENCOUNTER — Other Ambulatory Visit: Payer: Self-pay

## 2024-11-15 MED ORDER — TADALAFIL 2.5 MG PO TABS
2.5000 mg | ORAL_TABLET | Freq: Every day | ORAL | 1 refills | Status: AC
  Filled 2024-11-15: qty 90, 90d supply, fill #0

## 2024-11-17 ENCOUNTER — Other Ambulatory Visit (HOSPITAL_COMMUNITY): Payer: Self-pay

## 2024-11-18 ENCOUNTER — Other Ambulatory Visit (HOSPITAL_COMMUNITY): Payer: Self-pay

## 2024-12-16 ENCOUNTER — Other Ambulatory Visit: Payer: Self-pay

## 2024-12-16 ENCOUNTER — Other Ambulatory Visit (HOSPITAL_COMMUNITY): Payer: Self-pay

## 2024-12-16 MED ORDER — TADALAFIL 5 MG PO TABS
5.0000 mg | ORAL_TABLET | Freq: Every day | ORAL | 3 refills | Status: AC
  Filled 2024-12-16: qty 30, 30d supply, fill #0

## 2024-12-17 ENCOUNTER — Other Ambulatory Visit (HOSPITAL_COMMUNITY): Payer: Self-pay
